# Patient Record
Sex: Female | Born: 1945 | Race: White | Hispanic: No | Marital: Married
Health system: Midwestern US, Community
[De-identification: ages and names within clinical notes are randomized; demographics above are authoritative.]

## PROBLEM LIST (undated history)

## (undated) DIAGNOSIS — H269 Unspecified cataract: Secondary | ICD-10-CM

## (undated) DIAGNOSIS — I10 Essential (primary) hypertension: Secondary | ICD-10-CM

## (undated) DIAGNOSIS — H469 Unspecified optic neuritis: Secondary | ICD-10-CM

## (undated) DIAGNOSIS — I2510 Atherosclerotic heart disease of native coronary artery without angina pectoris: Secondary | ICD-10-CM

## (undated) DIAGNOSIS — E785 Hyperlipidemia, unspecified: Secondary | ICD-10-CM

## (undated) HISTORY — PX: ABDOMINAL HYSTERECTOMY: SHX81

## (undated) HISTORY — DX: Essential (primary) hypertension: I10

## (undated) HISTORY — DX: Unspecified optic neuritis: H46.9

## (undated) HISTORY — PX: CARDIAC CATHETERIZATION: SHX172

## (undated) HISTORY — PX: TOE SURGERY: SHX1073

## (undated) HISTORY — DX: Atherosclerotic heart disease of native coronary artery without angina pectoris: I25.10

## (undated) HISTORY — DX: Hyperlipidemia, unspecified: E78.5

## (undated) HISTORY — PX: CARDIAC SURGERY: SHX584

---

## 2002-02-03 NOTE — Progress Notes (Signed)
CORPORATE CARE CLINICAL SUMMARY   NAME:  Kathleen Leonard, Kathleen Leonard                            SS#:        [id number]   DOB:    12-17-1945                               AGE:        56   SEX:    F                                     LOCATION:  CORPORATE CARE   MR#:    [id number]                                 DATE:       02/03/2002   DICTATING PHYSICIAN:  Angus Palms, D.O.   The patient is here with complaint of right earache the last 24 hours,   preceded by sinus problems the last 2 weeks with congestion and pressure.   She tried Claritin-D and it helped some but not complete.  She has no   cough, no fever, just some slight headache developing with all of this.   She feels a bad taste with the drainage in the back of her mouth and her   teeth hurt on the right side.  She feels some sensations beneath the right   ear on the right side.   ALLERGIES:  None.   MEDICATIONS:  None.   PHYSICAL EXAMINATION:   VITALS:  Blood pressure 126/70, temperature 97.1, pulse 80, respirations   14, 5'6", 146 pounds.   Note, the patient is normally very healthy.  Examination shows slightly   sore right anterior nodes with mild swelling.  Throat red slightly without   pus.  Sinuses inflamed and congested.  Ears show fluid in the right ear to   a mild degree without any erythema.  The left tympanic membrane is fine.   The patient is hydrated.   ASSESSMENT:   1. Maxillary sinusitis.   2. Allergic rhinitis.   3. Otalgia.   PLAN:  Amoxicillin 500 mg t.i.d. x 10 days and Allegra-D q.12h. trial and   see response.  Push fluids and use Tylenol p.r.n.   Electronically Signed By:   Angus Palms, D.O. 07/18/2002 11:19   _____________________________________________   Angus Palms, D.O.   sp  D: 02/03/2002  T: 02/03/2002 10:17 A    [id number]

## 2002-05-29 NOTE — Progress Notes (Signed)
CORPORATE CARE CLINICAL SUMMARY   NAME:  Kathleen Leonard, Kathleen Leonard                            SS#:        [id number]   DOB:    [birth year]                               AGE:        56   SEX:    F                                     LOCATION:  CORPORATE CARE   MR#:    [id number]                                 DATE:       05/29/2002   cc:       Otilio Carpen, [name]             Juanda Bond, [name]   DICTATING PHYSICIAN:  Otilio Carpen, [name]   REFERRING PHYS:   PRIMARY PHYSICIAN:   HISTORY OF PRESENT ILLNESS:   The patient is a 56 year old female who   presents with complaints of sinus congestion and pressure in her sinuses.   She has thick nasal discharge which is draining posteriorly.  She does not   have a cough, she does not have any fever or chills, no nausea, vomiting or   diarrhea.  The patient's have worsened over the past 5 days. The patient   also complains of some swelling in her cheek around her jaw over the past 3   days.  Over the counter medications are not working.  The patient does have   intermittent allergy-type symptoms.   PAST MEDICAL HISTORY:  The patient denies any long-term medical problems.   MEDICATIONS:  No long-term medications.   ALLERGIES:  No known drug allergies.   GYN HISTORY:  The patient has had a hysterectomy at age 51 due to fibroids   and increased bleeding.   PHYSICAL EXAMINATION:   VITAL SIGNS:   Blood pressure 124/76, pulse 96, respirations 20,   temperature 97.5, height: 5'6", weight 151 pounds.   HEENT:   NC/AT, PERRLA., EOMI. Nose with boggy mucous membranes, mucus   discharge is noted. Posterior pharynx is mildly erythematous.  Teeth are in   good repair with fillings in place.  TMs are clear bilaterally.  The   patient is tender along the jaw line on the right to palpation.  She is   unable to open her jaw completely on the right due to discomfort in this   area. The patient is also tender in the submandibular area on the right and    she has shotty anterior cervical lymphadenopathy on the right as well. No   oral lesions are noted on exam of oropharynx.  No significant adenopathy.   HEENT otherwise within normal limits.   NECK:  Otherwise, supple and nontender.  No supraclavicular adenopathy.   HEART:   Regular rate and rhythm without murmur.   LUNGS:   Clear to auscultation bilaterally.   EXTREMITIES:  Normal and nontender.   NEUROLOGICAL:  Cranial nerves II-XII intact  bilaterally.  ASSESSMENT:   1. Upper respiratory infection - ?early sinusitis.   2. Allergic rhinitis.   3. Right submandibularitis (?).   PLAN:   1. P.o. fluids, rest, Tylenol p.r.n.   2. Allegra-D 60 mg p.o. b.i.d. p.r.n. congestion or allergy-type symptoms.      Nasonex nasal spray 1 spray each nostril daily as directed.   3. Augmentin 875 mg p.o. b.i.d. x 10 days.   4. Darvocet-N 100 (20 with no refill) 1 p.o. q.6h. p.r.n. pain.  The      patient may take Motrin or Advil as well.   5. The patient is referred to ENT  for  further evaluation and treatment of      her submandibular gland complaints.  She is to follow up after ENT      evaluation.  She is to call prior for worsening of symptoms or any      problems. The patient is also to follow up with her PCP in regard to her      visit today.  Routine followup otherwise.   Electronically Signed By:   Otilio Carpen, [name] 09/04/2002 11:54   _____________________________________________   Otilio Carpen, [name]   jdm  D: 05/29/2002  T: 05/30/2002  6:19 P    [id number]

## 2003-06-21 NOTE — ED Provider Notes (Signed)
[facility]                      EMERGENCY DEPARTMENT TREATMENT REPORT   NAME:  CONNI, KNIGHTON                          PT. LOCATION:     ER  ER07   MR #:         BILLING #: [id number]          DOS: 06/21/2003   TIME: 8:03 A   [id number]   cc:    Otilio Carpen, [name]   Primary Physician:   TIME SEEN:   [phone].   CHIEF COMPLAINT: "I think I have sinusitis."   HISTORY OF PRESENT ILLNESS:  A 57 year old female presents stating that for   the past 7 days she has had nasal congestion on the right side, not on the   left.  She states that yesterday she started experiencing extreme pain that   she localizes to her maxillary sinus and right upper jaw area.  The patient   states that the pain radiates into her right ear.  She denies fever or   chills, no vomiting.  She has had a couple episodes of loose stools.  The   patient states that last August she had similar pain and was diagnosed with   sinusitis.  She has been using Advil for the pain without relief.  She is   also using an over the counter nasal spray.   REVIEW OF SYSTEMS   CONSTITUTIONAL:   No fever or chills.   ENT:  Right sided nasal congestion with associated right-sided facial pain.   RESPIRATORY:   No cough or shortness of breath.   CARDIOVASCULAR:  No chest pain.   GASTROINTESTINAL:   No vomiting, positive for a couple episode of loose   stools.   GENITOURINARY:  No dysuria.   MUSCULOSKELETAL:   No myalgias.   INTEGUMENTARY:  No rashes.   NEUROLOGICAL:  No headache.   PAST MEDICAL HISTORY:  Unremarkable.   SOCIAL HISTORY: Here with her daughter.   ALLERGIES:  No known drug allergies.   MEDICATIONS:  Advil.   PHYSICAL EXAMINATION:   VITAL SIGNS:  Blood pressure 152/75, pulse 91, respirations 16, temperature   99.8.  Pain is 9/10.   GENERAL APPEARANCE:  The patient appears well developed and well nourished.   Appearance and behavior are age and situation appropriate.    HEENT:   Eyes:  Conjunctivae clear, lids normal.  Pupils equal,   symmetrical, and normally reactive.   Ears/Nose:  Hearing is grossly intact   to voice.  Internal and external examinations of the ears are unremarkable.   Mouth/Throat:  Surfaces of the pharynx, palate, and tongue are pink, moist,   and without lesions.   The patient is tender over the right maxillary   sinus, nontender over the left maxillary sinus, nontender over the   supraorbital sinuses.  The patient is uncomfortable with opening her jaw,   no trismus, no drooling.   NECK:  Supple, symmetrical, trachea midline.   LYMPHATICS:  No cervical or submandibular lymphadenopathy palpated.   PULMONARY:  Lungs clear bilaterally.   CARDIOVASCULAR:  Heart has regular rate and rhythm.   GI:  Abdomen soft, nontender, without complaint of pain to palpation.  No   hepatomegaly or splenomegaly.   MUSCULOSKELETAL:   Stance and gait appear normal.  SKIN:  Warm and dry.   PSYCHIATRIC:   Recent and remote memory appear to be intact.   NEUROLOGICAL:  No focal deficits.   INITIAL ASSESSMENT AND MANAGEMENT PLAN:  A 57 year old female presents   complaining of right-sided facial pain after 7 days of nasal congestion.   We will place the patient on Amoxicillin for sinusitis and give her a   couple of Vicodin for her discomfort.  She will be given 2 Vicodin here.   FINAL DIAGNOSIS:  Acute sinus pain.   DISPOSITION:      1. The patient is discharged home in stable condition, with instructions      to follow up with their regular doctor.  They are advised to return      immediately for any worsening or symptoms of concern.      2. Prescription written for Vicodin #10, Amoxicillin #20 and the patient      is also to use over-the-counter ibuprofen.      3. She is given sinusitis instructions.  She is to drink plenty of      fluids. She is advised that she may use an over-the-counter nasal spray.      She is written a note to return to work tomorrow with instructions to       only use the Vicodin at night if she is working or driving.   Electronically Signed By:   Luiz Iron, [name] 07/07/2003 00:12   ____________________________   Luiz Iron, [name]   rew  D:  06/21/2003  T:  06/21/2003  2:58 P   [id number]   Salem Caster, PA-C

## 2007-10-13 DIAGNOSIS — I219 Acute myocardial infarction, unspecified: Secondary | ICD-10-CM

## 2007-10-13 HISTORY — DX: Acute myocardial infarction, unspecified: I21.9

## 2008-08-13 NOTE — Procedures (Signed)
Test Reason : Pain   Blood Pressure : ***/*** mmHG   Vent. Rate : 110 BPM     Atrial Rate : 110 BPM   P-R Int : 138 ms          QRS Dur : 068 ms   QT Int : 304 ms       P-R-T Axes : 069 -43 -27 degrees   QTc Int : 411 ms   Sinus tachycardia with occasional premature ventricular complexes and fusion   Left axis deviation   Nonspecific ST abnormality   Abnormal ECG   No previous ECGs available   Confirmed by Old, [name], Wayne ([id number]) on 08/13/2008 5:55:29 PM   Referred By: Rick Manolio, MD           Overread By: Wayne Old, [name]

## 2008-08-13 NOTE — ED Provider Notes (Signed)
[facility]                      EMERGENCY DEPARTMENT TREATMENT REPORT   NAME:  Kathleen Leonard, Kathleen Leonard              PT. LOCATION:    [id number]       DOB:  [birth year]                                                                     AGE:  62   MR #:       BILLING #:           DOS: 08/13/2008  TIME: 5:28 P   SEX:  F   [id number]    [id number]   cc:   Primary Physician:   Primary Physician:  Montez Morita, MD   CHIEF COMPLAINT   Irregular EKG (electrocardiogram) at doctor's office, throat pain.   HISTORY OF PRESENT ILLNESS   This is a 62 year old female who was sent over by her primary care   physician after having an irregular EKG in his office.  The patient states   that for the last 2 weeks she has had increasing fatigue when taking her   usual walk during lunch.  She would start to experience and ache in her   throat and increased weakness.  She stated that the episodes initially   lasted about 10 minutes and were resolved when she went home and was able   to rest.  However, over the last 2 weeks the episodes increased in length   and her pain went from throat pain into head pain and her fatigue and   weakness increased significantly.  She began to have a racing heartbeat and   felt as if she was going to faint.  During these episodes she would feel   chest tightness behind her sternum.  She would be diaphoretic and short of   breath; however, she denied nausea, vomiting or diarrhea.  The patient also   noted vision changes at one time where she felt as if she saw "z's" going   across her visual field.  As of now, nothing makes her symptoms better or   worse.   REVIEW OF SYSTEMS   CONSTITUTIONAL:  No fever, chills, weight loss.   EYES:  A Z-like pattern across her visual field.   ENT: No sore throat, runny nose or other URI symptoms.  Throat pain.   RESPIRATORY:  Increasing shortness of breath, denies cough.   CARDIOVASCULAR:  Increasing chest tightness behind her sternum.  Racing   heartbeat.    GASTROINTESTINAL:  No vomiting, diarrhea, or abdominal pain.   GENITOURINARY:  No dysuria, frequency, or urgency.   MUSCULOSKELETAL:  Increasing weakness and fatigue.   INTEGUMENTARY:  Sweating.   NEUROLOGICAL:  Headaches.   PAST MEDICAL HISTORY   Flu vaccine is up to date, otherwise nothing.   SURGICAL HISTORY   Hysterectomy.   PSYCHIATRIC HISTORY   None.   SOCIAL HISTORY   Denies alcohol, tobacco and drug use.   FAMILY HISTORY   Her father does have a history of coronary artery disease and had stents   placed when he was in his 57s.  CURRENT MEDICATIONS   None.   ALLERGIES   No known drug allergies.   The patient was given aspirin at her doctor's office.   PHYSICAL EXAMINATION   VITAL SIGNS:  Blood pressure 149/98, pulse 109, respirations 18,   temperature 97.8, pain 0, O2 saturations 100% on room air.   GENERAL APPEARANCE: The patient appears to be quite concerned.  She is   diaphoretic, pale and anxious.   HEENT:  Eyes:  Conjunctivae clear, lids normal.  Pupils equal, symmetrical,   and normally reactive. Ears/Nose:  Hearing is grossly intact to voice.   Internal and external examinations of the ears and nose are unremarkable.   Mouth/Throat:  Surfaces of the pharynx, palate, and tongue are pink, moist,   and without lesions.   NECK:  Supple, nontender, symmetrical, no masses or JVD, trachea midline.   Thyroid not enlarged, nodular, or tender.   LYMPHATIC:  No cervical or submandibular lymphadenopathy palpated.   RESPIRATORY:  Clear and equal breath sounds.  No respiratory distress,   tachypnea, or accessory muscle use.   CARDIOVASCULAR:  Heart regular, without murmurs, gallops, rubs, or thrills.   PMI not displaced.  DP pulses 2+ and equal bilaterally.  No peripheral   edema or significant varicosities.  Vascular:  Calves soft and nontender.   CHEST:  When pressing on the chest, the patient feels chest tightness.   GI:  Abdomen soft, nontender, without complaint of pain to palpation.  No    hepatomegaly or splenomegaly.  No abdominal or inguinal masses appreciated   by inspection or palpation.   SKIN:  Diaphoretic, cool and clammy.   CONTINUATION BY DR. MANOLIO:   DIAGNOSTIC INTERPRETATION   EKG showed sinus rhythm with occasional PVC, no ischemic changes.  Troponin   was elevated at 1.31 but the rest of the cardiac enzymes were essentially   normal.  BMP, CBC and urinalysis were negative.  A chest x-ray read   negative by radiology as was CT scan of the head.   COURSE IN THE EMERGENCY DEPARTMENT   The patient was watched on cardiac and blood pressure monitors, remained in   sinus rhythm throughout her stay.  When her troponin returned elevated, I   called Dr. Deniece Portela Old from [facility], he has come to see the patient and is   admitting her to Telemetry.   PROCEDURE:  Critical care time including direct patient care, reviewing   medical record, evaluating results of diagnostic testing, discussions with   family members, and consulting with physicians:  40 minutes.   DIAGNOSIS   Acute precordial pain with elevated troponin, rule out myocardial   infarction.   DISPOSITION   The patient is admitted in stable condition to Telemetry.                                           Electronically Signed By:                                           Imogene Burn, [name] 08/24/2008 12:   ____________________________   Imogene Burn, [name]   Dictated by:  Alva Garnet, PA   My signature above authenticates this document and my orders, the final   diagnosis(es), discharge prescription(s) and instructions in the [facility]  P[facility] record.   SB1  D:  08/13/2008  T:  08/13/2008  5:46 P   [id number]/

## 2008-08-13 NOTE — H&P (Signed)
[facility]                              HISTORY AND PHYSICAL                                 Kathleen Leonard, [name]   NAMEERNESHA, Leonard   MR #:    [id number]                  ADM DATE:          08/13/2008   BILLING  [id number]                 PT. LOCATION       [id number]   #:   SS #     [id number]               DOB:  11-Jun-1946   AGE:  62   [name], [name]                    SEX:  F   cc:    Kathleen Leonard, [name]   CHIEF COMPLAINT   Exertional chest discomfort.   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  She went to the emergency room today   after a 2-week history of exertional discomfort.  She lives near the   hospital and regularly walks to work.  When she ambulates to the hospital   she has substernal chest tightness, which is relieved with rest.  It also   radiates to her head.  She went to the emergency room and had an EKG   showing nonspecific ST and T-wave changes; however, her troponin was   elevated at 1.3.  Due to the nature of her pain and elevated troponin I was   asked to evaluate her by Dr. Jama Flavors in the emergency room.   PAST MEDICAL HISTORY   Significant for the absence of hypertension, diabetes.  Her lipid status is   not known.  She is on no medications.   SOCIAL HISTORY   Significant for her not smoking cigarettes.  She does not abuse drugs or   alcohol.   FAMILY HISTORY   Strong for premature atherosclerotic coronary vascular disease.   REVIEW OF SYSTEMS   Significant for the absence of stroke or gastrointestinal bleeding.  There   is no history of joint discomfort, seizures or recent head injury.  She has   no prior cardiac history.   PHYSICAL EXAMINATION   GENERAL:  Physical examination demonstrates an alert and oriented white   female with a blood pressure 110/60, pulse is 80 and regular, respirations   are 12.    HEENT:   Sclerae nonicteric.  Pupils are equal bilaterally.   NECK:  Exam reveals full carotid upstrokes.  There is no jugular venous   distension.   CHEST:   Is clear.   CARDIOVASCULAR:  Exam reveals normal S1 and S2 with no murmurs, gallops or   rubs.  PMI is in the fourth intercostal space midclavicular line.   ABDOMEN:  Abdominal exam is soft and nontender.   EXTREMITIES:   Femoral pulses are equal bilaterally.  She has good distal   pulses and there is no lower  extremity edema.   A chest x-ray is normal.  Mediastinum appears to be normal.  EKG shows   normal sinus rhythm with nonspecific ST and T-wave changes.  Her admission   lab is significant for a normal CBC and a normal UA.  Her troponin is   elevated 1.31.  She had a CT scan of the head without contrast which shows   a hypoattenuation in the mid brain related to streak artifact, otherwise   normal.   IMPRESSION   1. Acute coronary syndrome with elevated troponin and exertional angina.   2. Family history for ischemic heart disease.   3. Unknown lipid status   DISCUSSION   The patient will be admitted to telemetry.  I have initiated medical   therapy consisting of aspirin, beta blocker and statin therapy.  We will   obtain fasting lipids in the morning and I will place her on Lovenox   tonight.  She will be n.p.o. for cardiac catheterization and possible   percutaneous coronary intervention on August 14, 2008 by my partner Dr.   Cyndia Diver.  I have explained to her the goals and risks of this   procedure including infection, bleeding, vascular injury, stroke,   myocardial infarction, death, need for transfer for emergency coronary   artery bypass grafting and use of Plavix long-term.  She is agreeable to   proceed.   I am grateful to Dr. Jama Flavors and Dr. Phylliss Bob for allowing me to have   the privilege of participating in the care this patient.   Electronically Signed By:   Donnel Saxon, [name] 08/14/2008 08:22   ____________________________    Kathleen Leonard, [name]   MD  D:  08/13/2008  T:  08/14/2008  5:20 A   [id number]

## 2008-08-13 NOTE — Procedures (Signed)
Test Reason : Pain   Blood Pressure : ***/*** mmHG   Vent. Rate : 110 BPM     Atrial Rate : 110 BPM   P-R Int : 138 ms          QRS Dur : 068 ms   QT Int : 304 ms       P-R-T Axes : 069 -43 -27 degrees   QTc Int : 411 ms   Sinus tachycardia with occasional premature ventricular complexes and fusion   Left axis deviation   Nonspecific ST abnormality   Abnormal ECG   No previous ECGs available   Confirmed by Old, [name], [facility] ([id number]) on 08/13/2008 5:55:29 PM   Referred By: Angelena Sole, MD           Overread By: Sandre Kitty, [name]

## 2008-08-14 NOTE — Procedures (Signed)
Test Reason : pre op cath   Blood Pressure : ***/*** mmHG   Vent. Rate : 086 BPM     Atrial Rate : 086 BPM   P-R Int : 104 ms          QRS Dur : 088 ms   QT Int : 438 ms       P-R-T Axes : 051 -48 -133 degrees   QTc Int : 524 ms   Sinus rhythm with short PR   Left anterior fascicular block   ST   ST   Prolonged QT   When compared with ECG of 13-Aug-2008 14:00,   Significant changes have occurred with ST abd T changes   Confirmed by Ashby, [name], Charles Jr. ([id number]) on 08/14/2008 9:56:32 AM   Referred By: Rick Manolio, MD           Overread By: Charles J   Ashby, [name]

## 2008-08-14 NOTE — Procedures (Signed)
[facility]                         CARDIAC CATHETERIZATION REPORT                                       ON                                  Leonard, Kathleen E   CATH PHYSICIAN:       Cyndia Diver, [name]   REF. PHYSICIAN:   DATE:               08/14/2008   BILLING#           [id number]   M.R. #:               [id number]   DOB:                1946-02-02   AGE:               62   SEX:                 F   CATH #:   LOCATION:            [id number]   cc:   Cyndia Diver, [name]         Phylliss Bob, [name]         JEFFREY RICH, [name]   PROCEDURES   1. Conscious sedation.   2. Right common femoral artery access.   3. Left heart catheterization.   4. Left ventriculogram.   5. Coronary angiography.   6. StarClose closure, right common femoral artery access site.   CATHETERS/MEDICATIONS   Please see accompanying form for details.   COMPLICATIONS   None   ESTIMATED BLOOD LOSS   Minimal.  No sample was removed.   OPERATOR   Dr. Rosario Jacks   ASSISTANT   None   PRE-PROCEDURE DIAGNOSES   1. Acute coronary syndrome with non-ST elevation myocardial infarction   2. Systemic arterial hypertension.   3. Dyslipidemia   4. Coronary artery disease.   FINDINGS   HEMODYNAMIC RESULTS   Aortic pressure 100/60.  Left ventricle pressure 100/20.  Left ventricular   end-diastolic pressure 20.   LEFT VENTRICULOGRAM   Left ventriculography in the 30 degree RAO projection reveals estimated   ejection 25% with function best preserved at the base.  There is extensive   akinesis of the mid anterior wall, anterior apical wall, apex and   inferoapical wall, consistent with an insult in the LAD distribution.  No   mitral regurgitation.  No gradient across the aortic valve on pullback.   CORONARY ANGIOGRAPHY   Left main coronary artery was large and had a diffuse area of 20-30%   stenosis extending in its midportion.   Left anterior descending artery is relatively small in caliber proximally    and throughout its midportion and then becomes moderate in caliber   distally.  There is severe diffuse disease involving the proximal and mid   LAD with sequential stenotic lesions in the LAD including 50% near ostial   stenosis after the takeoff of a high diagonal followed by area of diffuse   disease up to 80% stenosis in the proximal LAD.  The  mid LAD after the   takeoff of a small caliber diagonal branch has severe diffuse disease   including 70% stenosis immediately after the diagonal followed by diffuse   50% irregularities culminating in a critical 99% hazy stenotic lesion in   the mid LAD.  This lesion is followed by 90% stenosis and thereafter, there   is 40% irregularities in the late mid LAD.  The distal LAD plumps up and is   moderate in caliber and of adequate caliber for bypass.  The apical LAD has   a region of 60% stenosis that traverses the apex and a region of very small   vessel caliber supplying a small area of myocardium.  The length of severe   diffuse disease in the LAD extending from the near ostium to the midportion   of the vessel and the small caliber of the vessel and the left ventricular   dysfunction make this vessel poorly suited for percutaneous intervention   and best served with bypass surgery.  There is also TIMI grade II flow   distally.   Circumflex artery is moderate and nondominant.  There is a large   bifurcating intermediate marginal which is normal and one obtuse marginal   which is small and normal.   Right coronary artery is a moderate caliber dominant vessel with 80% early   mid stenosis followed by diffuse diseased segment in the mid RCA up to 40%   and a relatively small-caliber vessel.  The distal RCA is moderate in   caliber and gives rise to a small to moderate posterior descending artery   and posterior left ventricular branch system, both of which are normal.   Sheath angiography revealed access above the femoral bifurcation.  The    patient underwent successful StarClose closure of the right common femoral   artery access site.   OVERALL IMPRESSION   1. Critical two-vessel coronary artery disease.          A.  Severe diffuse disease in the proximal and mid left anterior   descending artery up to 99% in severity in a              region of   small-vessel caliber with excellent distal target and TIMI grade II flow   poorly suited for                         percutaneous intervention.          B.  An 80% mid right coronary artery stenosis surrounding area of   diffuse disease.          C.  Patent left circumflex and intermediate marginal.   2. Severe left ventricular dysfunction ejection fraction 25% with anterior   wall, anterior apical and distal inferior        wall akinesis.  Degree of   left ventricular dysfunction is out of proportion to the small troponin   rise of 1.8               suggesting stunned myocardium or potentially a   delayed presentation of myocardial infarction, absence of          Q-waves   in the anterior precordium suggests primarily stunned myocardium.   3.    Elevated left ventricle end-diastolic pressure to 20 mmHg.   4.    StarClose closure right common femoral artery access site with   excellent hemostasis.   PLAN  1. The patient will be transferred to the [facility]      for coronary artery bypass surgery to the left anterior descending      artery and right coronary artery.   2. Will start the patient on heparin and Integrilin.  She will be continued      on aspirin with addition of beta blocker to her regimen.  My plan would      be to switch her to Coreg postoperatively.  Given her low blood pressure      and need for bypass surgery, we will hold off on adding an ACE inhibitor      to her regimen.  She will be empirically treated with a statin.  Plan      would be to initiate ACE inhibitor therapy and potentially Inspra after       her bypass surgery to optimize left ventricular recovery and prevent      adverse remodelling.   Thank you very much for allowing [place] put in the care of this patient.   ADDENDUM   The LAD does have a small first diagonal branch that has a 60% ostial   narrowing that I did not mention during my dictation.  The second diagonal   is moderate in caliber and 30-40% mid stenosis.   Electronically Signed By:   Cyndia Diver, [name] 09/26/2008 06:32   ___________________________________________   Cyndia Diver, [name]   AK  D: 08/14/2008  T: 08/14/2008  6:49 P    [id number]

## 2008-08-14 NOTE — Procedures (Signed)
Test Reason : pre op cath   Blood Pressure : ***/*** mmHG   Vent. Rate : 086 BPM     Atrial Rate : 086 BPM   P-R Int : 104 ms          QRS Dur : 088 ms   QT Int : 438 ms       P-R-T Axes : 051 -48 -133 degrees   QTc Int : 524 ms   Sinus rhythm with short PR   Left anterior fascicular block   ST   ST   Prolonged QT   When compared with ECG of 13-Aug-2008 14:00,   Significant changes have occurred with ST abd T changes   Confirmed by Cleon Gustin, [name], Madelynn Done. ([id number]) on 08/14/2008 9:56:32 AM   Referred By: Angelena Sole, MD           Overread By: Merlene Pulling, [name]

## 2008-08-14 NOTE — Procedures (Signed)
[facility]                         CARDIAC CATHETERIZATION REPORT                                       ON                                  Kathleen Leonard, Kathleen Leonard   CATH PHYSICIAN:       Cyndia Diver, [name]   REF. PHYSICIAN:   DATE:               08/14/2008   BILLING#           [id number]   M.R. #:               [id number]   DOB:                [birth year]   AGE:               62   SEX:                 F   CATH #:   LOCATION:            [id number]   cc:   Cyndia Diver, [name]         Phylliss Bob, [name]         JEFFREY RICH, [name]   PROCEDURES   1. Conscious sedation.   2. Right common femoral artery access.   3. Left heart catheterization.   4. Left ventriculogram.   5. Coronary angiography.   6. StarClose closure, right common femoral artery access site.   CATHETERS/MEDICATIONS   Please see accompanying form for details.   COMPLICATIONS   None   ESTIMATED BLOOD LOSS   Minimal.  No sample was removed.   OPERATOR   Dr. Rosario Jacks   ASSISTANT   None   PRE-PROCEDURE DIAGNOSES   1. Acute coronary syndrome with non-ST elevation myocardial infarction   2. Systemic arterial hypertension.   3. Dyslipidemia   4. Coronary artery disease.   FINDINGS   HEMODYNAMIC RESULTS   Aortic pressure 100/60.  Left ventricle pressure 100/20.  Left ventricular   end-diastolic pressure 20.   LEFT VENTRICULOGRAM   Left ventriculography in the 30 degree RAO projection reveals estimated   ejection 25% with function best preserved at the base.  There is extensive   akinesis of the mid anterior wall, anterior apical wall, apex and   inferoapical wall, consistent with an insult in the LAD distribution.  No   mitral regurgitation.  No gradient across the aortic valve on pullback.   CORONARY ANGIOGRAPHY   Left main coronary artery was large and had a diffuse area of 20-30%   stenosis extending in its midportion.   Left anterior descending artery is relatively small in caliber proximally   and throughout its midportion and  then becomes moderate in caliber   distally.  There is severe diffuse disease involving the proximal and mid   LAD with sequential stenotic lesions in the LAD including 50% near ostial   stenosis after the takeoff of a high diagonal followed by area of diffuse   disease up to 80% stenosis in the proximal LAD.  The  mid LAD after the   takeoff of a small caliber diagonal branch has severe diffuse disease   including 70% stenosis immediately after the diagonal followed by diffuse   50% irregularities culminating in a critical 99% hazy stenotic lesion in   the mid LAD.  This lesion is followed by 90% stenosis and thereafter, there   is 40% irregularities in the late mid LAD.  The distal LAD plumps up and is   moderate in caliber and of adequate caliber for bypass.  The apical LAD has   a region of 60% stenosis that traverses the apex and a region of very small   vessel caliber supplying a small area of myocardium.  The length of severe   diffuse disease in the LAD extending from the near ostium to the midportion   of the vessel and the small caliber of the vessel and the left ventricular   dysfunction make this vessel poorly suited for percutaneous intervention   and best served with bypass surgery.  There is also TIMI grade II flow   distally.   Circumflex artery is moderate and nondominant.  There is a large   bifurcating intermediate marginal which is normal and one obtuse marginal   which is small and normal.   Right coronary artery is a moderate caliber dominant vessel with 80% early   mid stenosis followed by diffuse diseased segment in the mid RCA up to 40%   and a relatively small-caliber vessel.  The distal RCA is moderate in   caliber and gives rise to a small to moderate posterior descending artery   and posterior left ventricular branch system, both of which are normal.   Sheath angiography revealed access above the femoral bifurcation.  The   patient underwent successful StarClose closure of the right  common femoral   artery access site.   OVERALL IMPRESSION   1. Critical two-vessel coronary artery disease.          A.  Severe diffuse disease in the proximal and mid left anterior   descending artery up to 99% in severity in a              region of   small-vessel caliber with excellent distal target and TIMI grade II flow   poorly suited for                         percutaneous intervention.          B.  An 80% mid right coronary artery stenosis surrounding area of   diffuse disease.          C.  Patent left circumflex and intermediate marginal.   2. Severe left ventricular dysfunction ejection fraction 25% with anterior   wall, anterior apical and distal inferior        wall akinesis.  Degree of   left ventricular dysfunction is out of proportion to the small troponin   rise of 1.8               suggesting stunned myocardium or potentially a   delayed presentation of myocardial infarction, absence of          Q-waves   in the anterior precordium suggests primarily stunned myocardium.   3.    Elevated left ventricle end-diastolic pressure to 20 mmHg.   4.    StarClose closure right common femoral artery access site with   excellent hemostasis.   PLAN  1. The patient will be transferred to the [facility]      for coronary artery bypass surgery to the left anterior descending      artery and right coronary artery.   2. Will start the patient on heparin and Integrilin.  She will be continued      on aspirin with addition of beta blocker to her regimen.  My plan would      be to switch her to Coreg postoperatively.  Given her low blood pressure      and need for bypass surgery, we will hold off on adding an ACE inhibitor      to her regimen.  She will be empirically treated with a statin.  Plan      would be to initiate ACE inhibitor therapy and potentially Inspra after      her bypass surgery to optimize left ventricular recovery and prevent      adverse remodelling.   Thank you very much for  allowing [place] put in the care of this patient.   ADDENDUM   The LAD does have a small first diagonal branch that has a 60% ostial   narrowing that I did not mention during my dictation.  The second diagonal   is moderate in caliber and 30-40% mid stenosis.   Electronically Signed By:   Cyndia Diver, [name] 09/26/2008 06:32   ___________________________________________   Cyndia Diver, [name]   AK  D: 08/14/2008  T: 08/14/2008  6:49 P    [id number]

## 2008-09-28 LAB — VITAMIN D, 25 HYDROXY: Vitamin D 25-Hydroxy: 41 ng/mL (ref 30–80)

## 2012-10-21 LAB — LIPID PANEL
CHOL/HDL Ratio: 1.8 Ratio (ref 0.0–4.4)
Cholesterol, total: 116 mg/dl — ABNORMAL LOW (ref 140–199)
HDL Cholesterol: 66 mg/dl (ref 40–96)
LDL, calculated: 41 mg/dl (ref 0–130)
Triglyceride: 46 mg/dl (ref 29–150)

## 2012-10-21 LAB — ALT: ALT (SGPT): 25 U/L (ref 12–78)

## 2012-10-21 LAB — CK: CK: 58 U/L (ref 26–192)

## 2012-10-21 LAB — AST: AST (SGOT): 19 U/L (ref 15–37)

## 2016-12-17 NOTE — Other (Signed)
[facility]  PREOPERATIVE INSTRUCTIONS    How to prepare:    Nothing to eat or drink after midnight the night before surgery, unless otherwise specified.  This includes gum and mints.  You may brush your teeth in the morning, however do not swallow any water.  Do not take any medication the morning of your procedure without your physician's approval.  Please speak with the Pre-Admission testing nurse at [phone] regarding specific instructions about your medications.  Please remove all jewelry and body piercings and leave all valuables at home.  Remove nail polish and contact lenses.  You MUST make arrangements for a responsible adult to take you home after your surgery, analgesia or sedation.  It is strongly suggested that a responsible adult stay with you during the first 24 hours.  Please follow your surgeon's instructions regarding the time you need to [facility].  If you are late [facility] due to any unforeseen circumstance or your are ill the morning of surgery and need to cancel or be delayed, please call [facility] at [phone] or [phone]  If you need to cancel your surgery prior to the day of surgery, call your surgeon.      What to bring with you:    Paperwork from your doctor's office that you have been given.  Insurance cards, a picture ID and a method to pay your insurance co-pay.  Wear comfortable, loose fitting clothing that will be easy for you to put back on after surgery.  If you are having cataract surgery, please bring all the eye drops that you have been given by your surgeon, including our antibiotic ointment/drops.  Wear warm clothes from waist down, they will be left on.      I have reviewed the above instructions:              Patient: Kathleen Leonard  Date:     December 17, 2016 Time:   3:25 PM       RN:  Mechele CollinJoan E Williams, LPN  Date:     December 17, 2016 Time:   3:25 PM

## 2016-12-23 ENCOUNTER — Inpatient Hospital Stay: Payer: MEDICARE

## 2016-12-23 MED ORDER — TROPICAMIDE 1 % EYE DROPS
1 % | OPHTHALMIC | Status: AC
Start: 2016-12-23 — End: 2016-12-23
  Administered 2016-12-23 (×2): via OPHTHALMIC

## 2016-12-23 MED ORDER — ACETAMINOPHEN 500 MG TAB
500 mg | ORAL | Status: DC | PRN
Start: 2016-12-23 — End: 2016-12-23

## 2016-12-23 MED ORDER — SODIUM CHLORIDE 0.9 % IV
INTRAVENOUS | Status: DC
Start: 2016-12-23 — End: 2016-12-23
  Administered 2016-12-23: 12:00:00 via INTRAVENOUS

## 2016-12-23 MED ORDER — POVIDONE-IODINE 5 % EYE SOLN
5 % | OPHTHALMIC | Status: DC | PRN
Start: 2016-12-23 — End: 2016-12-23
  Administered 2016-12-23: 13:00:00 via OPHTHALMIC

## 2016-12-23 MED ORDER — MIDAZOLAM 1 MG/ML IJ SOLN
1 mg/mL | INTRAMUSCULAR | Status: DC | PRN
Start: 2016-12-23 — End: 2016-12-23

## 2016-12-23 MED ORDER — PHENYLEPHRINE 10 % EYE DROPS
10 % | Freq: Once | OPHTHALMIC | Status: DC | PRN
Start: 2016-12-23 — End: 2016-12-23

## 2016-12-23 MED ORDER — PREDNISOLONE ACETATE 1 % EYE DROPS, SUSP
1 % | OPHTHALMIC | Status: AC
Start: 2016-12-23 — End: 2016-12-23
  Administered 2016-12-23 (×2): via OPHTHALMIC

## 2016-12-23 MED ORDER — ACETAZOLAMIDE 250 MG TAB
250 mg | Freq: Once | ORAL | Status: AC
Start: 2016-12-23 — End: 2016-12-23
  Administered 2016-12-23: 14:00:00 via ORAL

## 2016-12-23 MED ORDER — LIDOCAINE 1% - PHENYLEPHRINE 1.5% 1 ML OPTHALMIC INJECTION
INTRAOCULAR | Status: DC | PRN
Start: 2016-12-23 — End: 2016-12-23
  Administered 2016-12-23: 13:00:00 via INTRAOCULAR

## 2016-12-23 MED ORDER — PHENYLEPHRINE 2.5 % EYE DROPS
2.5 % | OPHTHALMIC | Status: AC
Start: 2016-12-23 — End: 2016-12-23
  Administered 2016-12-23 (×2): via OPHTHALMIC

## 2016-12-23 MED ORDER — BALANCED SALT IRRIG SOLN COMB2 INTRAOCULAR
INTRAOCULAR | Status: DC | PRN
Start: 2016-12-23 — End: 2016-12-23
  Administered 2016-12-23: 13:00:00 via INTRAOCULAR

## 2016-12-23 MED ORDER — OFLOXACIN 0.3 % EYE DROPS
0.3 % | OPHTHALMIC | Status: AC
Start: 2016-12-23 — End: 2016-12-23
  Administered 2016-12-23 (×2): via OPHTHALMIC

## 2016-12-23 MED ORDER — PROPARACAINE 0.5 % EYE DROPS
0.5 % | Freq: Once | OPHTHALMIC | Status: AC
Start: 2016-12-23 — End: 2016-12-23
  Administered 2016-12-23: 12:00:00 via OPHTHALMIC

## 2016-12-23 MED ORDER — MOXIFLOXACIN 0.1% INTRACAMERAL INJECTION
0.1 % | OPHTHALMIC | Status: DC | PRN
Start: 2016-12-23 — End: 2016-12-23
  Administered 2016-12-23: 13:00:00

## 2016-12-23 MED ORDER — PROPARACAINE 0.5 % EYE DROPS
0.5 % | OPHTHALMIC | Status: DC | PRN
Start: 2016-12-23 — End: 2016-12-23
  Administered 2016-12-23: 13:00:00 via OPHTHALMIC

## 2016-12-23 MED ORDER — NALOXONE 0.4 MG/ML INJECTION
0.4 mg/mL | INTRAMUSCULAR | Status: DC | PRN
Start: 2016-12-23 — End: 2016-12-23

## 2016-12-23 MED ORDER — MIDAZOLAM 1 MG/ML IJ SOLN
1 mg/mL | INTRAMUSCULAR | Status: DC | PRN
Start: 2016-12-23 — End: 2016-12-23
  Administered 2016-12-23 (×3): via INTRAVENOUS

## 2016-12-23 MED ORDER — FLUMAZENIL 0.1 MG/ML IV SOLN
0.1 mg/mL | INTRAVENOUS | Status: DC | PRN
Start: 2016-12-23 — End: 2016-12-23

## 2016-12-23 MED ORDER — CHONDROITIN-SOD HYALURON 3 %-4 % (0.5 ML) 1 %(0.55 ML) INTRAOCULAR KIT
3 %-4 %(0.5 mL) 1 % (0.55 mL) | INTRAOCULAR | Status: DC | PRN
Start: 2016-12-23 — End: 2016-12-23
  Administered 2016-12-23: 13:00:00 via INTRAOCULAR

## 2016-12-23 MED FILL — PHENYLEPHRINE 2.5 % EYE DROPS: 2.5 % | OPHTHALMIC | Qty: 15

## 2016-12-23 MED FILL — PREDNISOLONE ACETATE 1 % EYE DROPS, SUSP: 1 % | OPHTHALMIC | Qty: 5

## 2016-12-23 MED FILL — PHENYLEPHRINE 10 % EYE DROPS: 10 % | OPHTHALMIC | Qty: 5

## 2016-12-23 MED FILL — MIDAZOLAM 1 MG/ML IJ SOLN: 1 mg/mL | INTRAMUSCULAR | Qty: 1

## 2016-12-23 MED FILL — TROPICAMIDE 1 % EYE DROPS: 1 % | OPHTHALMIC | Qty: 15

## 2016-12-23 MED FILL — OFLOXACIN 0.3 % EYE DROPS: 0.3 % | OPHTHALMIC | Qty: 5

## 2016-12-23 MED FILL — SODIUM CHLORIDE 0.9 % IV: INTRAVENOUS | Qty: 1000

## 2016-12-23 MED FILL — MAPAP EXTRA STRENGTH 500 MG TABLET: 500 mg | ORAL | Qty: 1

## 2016-12-23 MED FILL — ACETAZOLAMIDE 250 MG TAB: 250 mg | ORAL | Qty: 1

## 2016-12-23 MED FILL — FLUMAZENIL 0.1 MG/ML IV SOLN: 0.1 mg/mL | INTRAVENOUS | Qty: 2

## 2016-12-23 MED FILL — PROPARACAINE 0.5 % EYE DROPS: 0.5 % | OPHTHALMIC | Qty: 15

## 2016-12-23 MED FILL — NALOXONE 0.4 MG/ML INJECTION: 0.4 mg/mL | INTRAMUSCULAR | Qty: 0.25

## 2016-12-23 NOTE — Anesthesia Pre-Procedure Evaluation (Signed)
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     GI/Hepatic/Renal  Within defined limits              Endo/Other  Within defined limits           Other Findings              Physical Exam    Airway  Mallampati: II  TM Distance: 4 - 6 cm  Neck ROM: normal range of motion   Mouth opening: Normal     Cardiovascular  Regular rate and rhythm,  S1 and S2 normal,  no murmur, click, rub, or gallop             Dental  No notable dental hx       Pulmonary  Breath sounds clear to auscultation               Abdominal  GI exam deferred       Other Findings            Anesthetic Plan    ASA: 3  Anesthesia type: MAC          Induction: Intravenous  Anesthetic plan and risks discussed with: Patient

## 2016-12-23 NOTE — Op Note (Signed)
Operative Note      Patient: Kathleen DriversLinda E Leonard MRN: [id number]  SSN: [id number]    Date of Birth: 08/08/1946  Age: 71 y.o.  Sex: female      Date of Surgery: 12/23/2016    Preoperative Diagnosis: Cataract - visually significant left eye    Postoperative Diagnosis:  Cataract - visually significant left eye    Procedure: Phacoemulsification and insertion of intraocular lens left eye    Anesthesia:  Topical with monitored anesthesia care    Surgeon:  Andres ShadPaul M. Lavena BullionGriffey, [name]    Description:    After obtaining proper informed consent, the operative eye was dilated and the patient was brought to the surgery suite and placed in the supine position.  The operative eye was then prepped and draped in the standard surgical fashion.  A paracentesis was then placed at the approximate 1:00 position and the eye was filled with 1.0% preservative-free Xylocaine.  Viscoelastic was then injected through the paracentesis site and a 2.4 keratome was passed through clear cornea at the approximate 10:00 position.  A bent needle cystatome was then used to create a continuous curvilinear capsulorhexis.  Hydrodisection and hydrodemarcation was then performed followed by phacoemulsification of the nucleus via a four-quadrant divide and conquer technique.  Irrigation and aspiration was then used to remove the remaining cortex material and viscoelastic was injected into the capsular bag.  A SA60WF 20.5 lens was then injected into the capsular bag with the trailing haptic spun into place without difficulty.  Irrigation and aspiration was then used to remove the remaining viscoelastic. The clear corneal wound was hydrated at the stromal level and when checked for a leak revealed none.  0.1 mL of  Moxifloxacin was injected into the anterior chamber.  The patient returned to the recovery area awake and in stable condition.  The patient will follow up tomorrow for further evaluation.      Findings:  Cataract     Limbal Relaxing Incision (LRI):  NO      Miostat / Miochol Administered:  NO    Specimens:  None    Assistants:  None    Complications:  None    Blood Loss:  Zero        Andres ShadPaul M. Lavena BullionGriffey, [name]  December 23, 2016  10:05 AM

## 2016-12-23 NOTE — Anesthesia Post-Procedure Evaluation (Signed)
Post-Anesthesia Evaluation and Assessment    Patient: Kathleen Leonard MRN: [id number]  SSN: [id number]    Date of Birth: [birth year]  Age: 71 y.o.  Sex: female       Cardiovascular Function/Vital Signs  Visit Vitals   ??? BP 113/64   ??? Pulse 67   ??? Temp 36.5 ??C (97.7 ??F)   ??? Resp 18   ??? Ht 5' 6"  (1.676 m)   ??? Wt 71.7 kg (158 lb)   ??? SpO2 94%   ??? BMI 25.5 kg/m2       Patient is status post MAC anesthesia for Procedure(s):  CATARACT EXTRACTION WITH INTRA OCULAR LENS IMPLANT/ LEFT EYE.    Nausea/Vomiting: None    Postoperative hydration reviewed and adequate.    Pain:  Pain Scale 1: Numeric (0 - 10) (12/23/16 0926)  Pain Intensity 1: 0 (12/23/16 0926)   Managed    Neurological Status:       At baseline    Mental Status and Level of Consciousness: Arousable    Pulmonary Status:   O2 Device: Room air (12/23/16 0926)   Adequate oxygenation and airway patent    Complications related to anesthesia: None    Post-anesthesia assessment completed. No concerns    Signed By: Renae Fickle, MD     December 23, 2016

## 2016-12-23 NOTE — Op Note (Signed)
Operative Note      Patient: Kathleen DriversLinda E Lie MRN: [id number]  SSN: [id number]    Date of Birth: 10/23/1945  Age: 71 y.o.  Sex: female      Date of Surgery: 12/23/2016    Preoperative Diagnosis: Cataract - visually significant left eye    Postoperative Diagnosis:  Cataract - visually significant left eye    Procedure: Phacoemulsification and insertion of intraocular lens left eye    Anesthesia:  Topical with monitored anesthesia care    Surgeon:  Andres ShadPaul M. Lavena BullionGriffey, [name]    Description:    After obtaining proper informed consent, the operative eye was dilated and the patient was brought to the surgery suite and placed in the supine position.  The operative eye was then prepped and draped in the standard surgical fashion.  A paracentesis was then placed at the approximate 1:00 position and the eye was filled with 1.0% preservative-free Xylocaine.  Viscoelastic was then injected through the paracentesis site and a 2.4 keratome was passed through clear cornea at the approximate 10:00 position.  A bent needle cystatome was then used to create a continuous curvilinear capsulorhexis.  Hydrodisection and hydrodemarcation was then performed followed by phacoemulsification of the nucleus via a four-quadrant divide and conquer technique.  Irrigation and aspiration was then used to remove the remaining cortex material and viscoelastic was injected into the capsular bag.  A SA60WF 20.5 lens was then injected into the capsular bag with the trailing haptic spun into place without difficulty.  Irrigation and aspiration was then used to remove the remaining viscoelastic. The clear corneal wound was hydrated at the stromal level and when checked for a leak revealed none.  0.1 mL of  Moxifloxacin was injected into the anterior chamber.  The patient returned to the recovery area awake and in stable condition.  The patient will follow up tomorrow for further evaluation.      Findings:  Cataract     Limbal Relaxing Incision (LRI):  NO     Miostat /  Miochol Administered:  NO    Specimens:  None    Assistants:  None    Complications:  None    Blood Loss:  Zero        Andres ShadPaul M. Lavena BullionGriffey, [name]  December 23, 2016  10:05 AM

## 2017-01-01 NOTE — Other (Signed)
[facility]  PREOPERATIVE INSTRUCTIONS    How to prepare:    Nothing to eat or drink after midnight the night before surgery, unless otherwise specified.  This includes gum and mints.  You may brush your teeth in the morning, however do not swallow any water.      Declined other instructions      I have reviewed the above instructions:              Patient: Marilynne DriversLinda E Pineda  Date:     January 01, 2017 Time:   2:51 PM       RN:  Mechele CollinJoan E Williams, LPN  Date:     January 01, 2017 Time:   2:51 PM

## 2017-01-05 NOTE — Anesthesia Pre-Procedure Evaluation (Addendum)
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     GI/Hepatic/Renal  Within defined limits              Endo/Other  Within defined limits           Other Findings            Physical Exam    Airway  Mallampati: II  TM Distance: 4 - 6 cm  Neck ROM: normal range of motion   Mouth opening: Normal     Cardiovascular  Regular rate and rhythm,  S1 and S2 normal,  no murmur, click, rub, or gallop             Dental  No notable dental hx       Pulmonary  Breath sounds clear to auscultation               Abdominal  GI exam deferred       Other Findings            Anesthetic Plan    ASA: 3  Anesthesia type: MAC          Induction: Intravenous  Anesthetic plan and risks discussed with: Patient

## 2017-01-06 ENCOUNTER — Inpatient Hospital Stay: Payer: MEDICARE

## 2017-01-06 MED ORDER — ACETAMINOPHEN 500 MG TAB
500 mg | ORAL | Status: DC | PRN
Start: 2017-01-06 — End: 2017-01-06

## 2017-01-06 MED ORDER — ACETAMINOPHEN 500 MG TAB
500 mg | Freq: Once | ORAL | Status: DC | PRN
Start: 2017-01-06 — End: 2017-01-06

## 2017-01-06 MED ORDER — MIDAZOLAM 1 MG/ML IJ SOLN
1 mg/mL | INTRAMUSCULAR | Status: DC | PRN
Start: 2017-01-06 — End: 2017-01-06
  Administered 2017-01-06 (×2): via INTRAVENOUS

## 2017-01-06 MED ORDER — ONDANSETRON (PF) 4 MG/2 ML INJECTION
4 mg/2 mL | Freq: Once | INTRAMUSCULAR | Status: DC | PRN
Start: 2017-01-06 — End: 2017-01-06

## 2017-01-06 MED ORDER — MIDAZOLAM 1 MG/ML IJ SOLN
1 mg/mL | INTRAMUSCULAR | Status: DC | PRN
Start: 2017-01-06 — End: 2017-01-06

## 2017-01-06 MED ORDER — FLUMAZENIL 0.1 MG/ML IV SOLN
0.1 mg/mL | INTRAVENOUS | Status: DC | PRN
Start: 2017-01-06 — End: 2017-01-06

## 2017-01-06 MED ORDER — MOXIFLOXACIN 0.1% INTRACAMERAL INJECTION
0.1 % | OPHTHALMIC | Status: DC | PRN
Start: 2017-01-06 — End: 2017-01-06
  Administered 2017-01-06: 17:00:00 via OPHTHALMIC

## 2017-01-06 MED ORDER — CHONDROITIN-SOD HYALURON 3 %-4 % (0.5 ML) 1 %(0.55 ML) INTRAOCULAR KIT
3 %-4 %(0.5 mL) 1 % (0.55 mL) | INTRAOCULAR | Status: DC | PRN
Start: 2017-01-06 — End: 2017-01-06
  Administered 2017-01-06: 17:00:00 via INTRAOCULAR

## 2017-01-06 MED ORDER — PHENYLEPHRINE 2.5 % EYE DROPS
2.5 % | OPHTHALMIC | Status: AC
Start: 2017-01-06 — End: 2017-01-06
  Administered 2017-01-06 (×2): via OPHTHALMIC

## 2017-01-06 MED ORDER — ACETAZOLAMIDE 250 MG TAB
250 mg | Freq: Once | ORAL | Status: AC
Start: 2017-01-06 — End: 2017-01-06
  Administered 2017-01-06: 17:00:00 via ORAL

## 2017-01-06 MED ORDER — PROPARACAINE 0.5 % EYE DROPS
0.5 % | Freq: Once | OPHTHALMIC | Status: AC
Start: 2017-01-06 — End: 2017-01-06
  Administered 2017-01-06: 15:00:00 via OPHTHALMIC

## 2017-01-06 MED ORDER — OFLOXACIN 0.3 % EYE DROPS
0.3 % | OPHTHALMIC | Status: AC
Start: 2017-01-06 — End: 2017-01-06
  Administered 2017-01-06 (×2): via OPHTHALMIC

## 2017-01-06 MED ORDER — POVIDONE-IODINE 5 % EYE SOLN
5 % | OPHTHALMIC | Status: DC | PRN
Start: 2017-01-06 — End: 2017-01-06
  Administered 2017-01-06: 17:00:00 via OPHTHALMIC

## 2017-01-06 MED ORDER — LIDOCAINE 1% - PHENYLEPHRINE 1.5% 1 ML OPTHALMIC INJECTION
INTRAOCULAR | Status: DC | PRN
Start: 2017-01-06 — End: 2017-01-06
  Administered 2017-01-06: 17:00:00 via INTRAOCULAR

## 2017-01-06 MED ORDER — TROPICAMIDE 1 % EYE DROPS
1 % | OPHTHALMIC | Status: AC
Start: 2017-01-06 — End: 2017-01-06
  Administered 2017-01-06 (×2): via OPHTHALMIC

## 2017-01-06 MED ORDER — NALOXONE 0.4 MG/ML INJECTION
0.4 mg/mL | INTRAMUSCULAR | Status: DC | PRN
Start: 2017-01-06 — End: 2017-01-06

## 2017-01-06 MED ORDER — BALANCED SALT IRRIG SOLN COMB2 INTRAOCULAR
INTRAOCULAR | Status: DC | PRN
Start: 2017-01-06 — End: 2017-01-06
  Administered 2017-01-06: 17:00:00 via INTRAOCULAR

## 2017-01-06 MED ORDER — PHENYLEPHRINE 10 % EYE DROPS
10 % | Freq: Once | OPHTHALMIC | Status: DC | PRN
Start: 2017-01-06 — End: 2017-01-06

## 2017-01-06 MED ORDER — PROPARACAINE 0.5 % EYE DROPS
0.5 % | OPHTHALMIC | Status: DC | PRN
Start: 2017-01-06 — End: 2017-01-06
  Administered 2017-01-06: 17:00:00 via OPHTHALMIC

## 2017-01-06 MED ORDER — SODIUM CHLORIDE 0.9 % IV
INTRAVENOUS | Status: DC
Start: 2017-01-06 — End: 2017-01-06
  Administered 2017-01-06: 16:00:00 via INTRAVENOUS

## 2017-01-06 MED ORDER — PREDNISOLONE ACETATE 1 % EYE DROPS, SUSP
1 % | OPHTHALMIC | Status: AC
Start: 2017-01-06 — End: 2017-01-06
  Administered 2017-01-06 (×2): via OPHTHALMIC

## 2017-01-06 MED FILL — ONDANSETRON (PF) 4 MG/2 ML INJECTION: 4 mg/2 mL | INTRAMUSCULAR | Qty: 2

## 2017-01-06 MED FILL — MAPAP EXTRA STRENGTH 500 MG TABLET: 500 mg | ORAL | Qty: 1

## 2017-01-06 MED FILL — SODIUM CHLORIDE 0.9 % IV: INTRAVENOUS | Qty: 1000

## 2017-01-06 MED FILL — PROPARACAINE 0.5 % EYE DROPS: 0.5 % | OPHTHALMIC | Qty: 15

## 2017-01-06 MED FILL — PREDNISOLONE ACETATE 1 % EYE DROPS, SUSP: 1 % | OPHTHALMIC | Qty: 5

## 2017-01-06 MED FILL — PHENYLEPHRINE 10 % EYE DROPS: 10 % | OPHTHALMIC | Qty: 5

## 2017-01-06 MED FILL — OFLOXACIN 0.3 % EYE DROPS: 0.3 % | OPHTHALMIC | Qty: 5

## 2017-01-06 MED FILL — ACETAZOLAMIDE 250 MG TAB: 250 mg | ORAL | Qty: 1

## 2017-01-06 MED FILL — FLUMAZENIL 0.1 MG/ML IV SOLN: 0.1 mg/mL | INTRAVENOUS | Qty: 2

## 2017-01-06 MED FILL — NALOXONE 0.4 MG/ML INJECTION: 0.4 mg/mL | INTRAMUSCULAR | Qty: 0.25

## 2017-01-06 MED FILL — MIDAZOLAM 1 MG/ML IJ SOLN: 1 mg/mL | INTRAMUSCULAR | Qty: 1

## 2017-01-06 MED FILL — TROPICAMIDE 1 % EYE DROPS: 1 % | OPHTHALMIC | Qty: 15

## 2017-01-06 MED FILL — PHENYLEPHRINE 2.5 % EYE DROPS: 2.5 % | OPHTHALMIC | Qty: 15

## 2017-01-06 MED FILL — MAPAP EXTRA STRENGTH 500 MG TABLET: 500 mg | ORAL | Qty: 2

## 2017-01-06 NOTE — Anesthesia Post-Procedure Evaluation (Signed)
Post-Anesthesia Evaluation and Assessment    Patient: Kathleen Leonard MRN: [id number]  SSN: [id number]    Date of Birth: 05/03/1946  Age: 71 y.o.  Sex: female       Cardiovascular Function/Vital Signs  Visit Vitals   ??? BP 132/75   ??? Pulse 71   ??? Temp 36.7 ??C (98.1 ??F)   ??? Resp 16   ??? Ht 5' 6"  (1.676 m)   ??? Wt 71.7 kg (158 lb)   ??? SpO2 95%   ??? BMI 25.5 kg/m2       Patient is status post MAC anesthesia for Procedure(s):  CATARACT EXTRACTION WITH INTRA OCULAR LENS IMPLANT/ RT EYE.    Nausea/Vomiting: None    Postoperative hydration reviewed and adequate.    Pain:  Pain Scale 1: Numeric (0 - 10) (01/06/17 1313)  Pain Intensity 1: 0 (01/06/17 1313)   Managed    Neurological Status:       At baseline    Mental Status and Level of Consciousness: Arousable    Pulmonary Status:   O2 Device: Room air (01/06/17 1313)   Adequate oxygenation and airway patent    Complications related to anesthesia: None    Post-anesthesia assessment completed. No concerns    Signed By: Charline Bills, MD     January 06, 2017

## 2017-01-06 NOTE — Op Note (Signed)
Operative Note      Patient: Kathleen Leonard MRN: [id number]  SSN: [id number]    Date of Birth: 08/27/1946  Age: 71 y.o.  Sex: female      Date of Surgery: 01/06/2017    Preoperative Diagnosis: Cataract - visually significant right eye    Postoperative Diagnosis:  Cataract - visually significant right eye    Procedure: Phacoemulsification and insertion of intraocular lens right eye    Anesthesia:  Topical with monitored anesthesia care    Surgeon:  Kathleen Leonard, [name]    Description:    After obtaining proper informed consent, the operative eye was dilated and the patient was brought to the surgery suite and placed in the supine position.  The operative eye was then prepped and draped in the standard surgical fashion.  A paracentesis was then placed at the approximate 11:00 position and the eye was filled with 1.0% preservative-free Xylocaine.  Viscoelastic was then injected through the paracentesis site and a 2.4 keratome was passed through clear cornea at the approximate 9:00 position.  A bent needle cystatome was then used to create a continuous curvilinear capsulorhexis.  Hydrodisection and hydrodemarcation was then performed followed by phacoemulsification of the nucleus via a four-quadrant divide and conquer technique.  Irrigation and aspiration was then used to remove the remaining cortex material and viscoelastic was injected into the capsular bag.  A SA60WF 20.0. lens was then injected into the capsular bag with the trailing haptic spun into place without difficulty.  Irrigation and aspiration was then used to remove the remaining viscoelastic. The clear corneal wound was hydrated at the stromal level and when checked for a leak revealed none.  0.1 mL of Moxifloxacin was injected into the anterior chamber.  The patient returned to the recovery area awake and in stable condition.  The patient will follow up tomorrow for further evaluation.      Findings:  Cataract     Limbal Relaxing Incision (LRI):  NO      Miostat / Miochol Administered:  NO    Specimens:  None    Assistants:  None    Complications:  None    Blood Loss:  Zero        Kathleen Leonard, [name]  January 06, 2017  1:03 PM

## 2017-01-06 NOTE — Op Note (Signed)
Operative Note      Patient: Kathleen Leonard MRN: [id number]  SSN: [id number]    Date of Birth: 11/27/1945  Age: 71 y.o.  Sex: female      Date of Surgery: 01/06/2017    Preoperative Diagnosis: Cataract - visually significant right eye    Postoperative Diagnosis:  Cataract - visually significant right eye    Procedure: Phacoemulsification and insertion of intraocular lens right eye    Anesthesia:  Topical with monitored anesthesia care    Surgeon:  Kathleen Leonard, [name]    Description:    After obtaining proper informed consent, the operative eye was dilated and the patient was brought to the surgery suite and placed in the supine position.  The operative eye was then prepped and draped in the standard surgical fashion.  A paracentesis was then placed at the approximate 11:00 position and the eye was filled with 1.0% preservative-free Xylocaine.  Viscoelastic was then injected through the paracentesis site and a 2.4 keratome was passed through clear cornea at the approximate 9:00 position.  A bent needle cystatome was then used to create a continuous curvilinear capsulorhexis.  Hydrodisection and hydrodemarcation was then performed followed by phacoemulsification of the nucleus via a four-quadrant divide and conquer technique.  Irrigation and aspiration was then used to remove the remaining cortex material and viscoelastic was injected into the capsular bag.  A SA60WF 20.0. lens was then injected into the capsular bag with the trailing haptic spun into place without difficulty.  Irrigation and aspiration was then used to remove the remaining viscoelastic. The clear corneal wound was hydrated at the stromal level and when checked for a leak revealed none.  0.1 mL of Moxifloxacin was injected into the anterior chamber.  The patient returned to the recovery area awake and in stable condition.  The patient will follow up tomorrow for further evaluation.      Findings:  Cataract     Limbal Relaxing Incision (LRI):  NO     Miostat  / Miochol Administered:  NO    Specimens:  None    Assistants:  None    Complications:  None    Blood Loss:  Zero        Kathleen Leonard, [name]  January 06, 2017  1:03 PM

## 2017-02-03 ENCOUNTER — Inpatient Hospital Stay: Admit: 2017-02-03 | Discharge: 2017-02-03 | Payer: MEDICARE | Primary: Family Medicine

## 2017-02-03 DIAGNOSIS — E7800 Pure hypercholesterolemia, unspecified: Secondary | ICD-10-CM

## 2017-02-03 LAB — LIPID PANEL
CHOL/HDL Ratio: 2.5 Ratio (ref 0.0–4.4)
Cholesterol, total: 136 mg/dl — ABNORMAL LOW (ref 140–199)
HDL Cholesterol: 54 mg/dl (ref 40–96)
LDL, calculated: 66 mg/dl (ref 0–130)
Triglyceride: 81 mg/dl (ref 29–150)

## 2018-08-26 ENCOUNTER — Inpatient Hospital Stay: Admit: 2018-08-26 | Discharge: 2018-08-26 | Payer: MEDICARE | Primary: Family Medicine

## 2018-08-26 DIAGNOSIS — I25810 Atherosclerosis of coronary artery bypass graft(s) without angina pectoris: Secondary | ICD-10-CM

## 2018-08-26 LAB — HEPATIC FUNCTION PANEL
ALT (SGPT): 23 U/L (ref 12–78)
ALT: 23 U/L (ref 12–78)
AST (SGOT): 16 U/L (ref 15–37)
AST: 16 U/L (ref 15–37)
Albumin: 3.7 gm/dl (ref 3.4–5.0)
Albumin: 3.7 gm/dl (ref 3.4–5.0)
Alk. phosphatase: 76 U/L (ref 45–117)
Alkaline Phosphatase: 76 U/L (ref 45–117)
Bilirubin, Direct: 0.1 mg/dl (ref 0.0–0.2)
Bilirubin, direct: 0.1 mg/dl (ref 0.0–0.2)
Bilirubin, total: 0.3 mg/dl (ref 0.2–1.0)
Protein, total: 6.7 gm/dl (ref 6.4–8.2)
Total Bilirubin: 0.3 mg/dl (ref 0.2–1.0)
Total Protein: 6.7 gm/dl (ref 6.4–8.2)

## 2018-08-26 LAB — LIPID PANEL
CHOL/HDL Ratio: 3.1 Ratio (ref 0.0–4.4)
Chol/HDL Ratio: 3.1 Ratio (ref 0.0–4.4)
Cholesterol, Total: 140 mg/dl (ref 140–199)
Cholesterol, total: 140 mg/dl (ref 140–199)
HDL Cholesterol: 45 mg/dl (ref 40–96)
HDL: 45 mg/dl (ref 40–96)
LDL Calculated: 72 mg/dl (ref 0–130)
LDL, calculated: 72 mg/dl (ref 0–130)
Triglyceride: 117 mg/dl (ref 29–150)
Triglycerides: 117 mg/dl (ref 29–150)

## 2021-02-06 ENCOUNTER — Ambulatory Visit (INDEPENDENT_AMBULATORY_CARE_PROVIDER_SITE_OTHER): Payer: Medicare Other | Admitting: Nurse Practitioner

## 2021-02-06 ENCOUNTER — Encounter: Payer: Self-pay | Admitting: Nurse Practitioner

## 2021-02-06 ENCOUNTER — Other Ambulatory Visit: Payer: Self-pay

## 2021-02-06 VITALS — BP 134/75 | HR 83 | Temp 98.0°F | Ht 64.02 in | Wt 153.5 lb

## 2021-02-06 DIAGNOSIS — Z1231 Encounter for screening mammogram for malignant neoplasm of breast: Secondary | ICD-10-CM

## 2021-02-06 DIAGNOSIS — I219 Acute myocardial infarction, unspecified: Secondary | ICD-10-CM | POA: Insufficient documentation

## 2021-02-06 DIAGNOSIS — Z7689 Persons encountering health services in other specified circumstances: Secondary | ICD-10-CM

## 2021-02-06 NOTE — Assessment & Plan Note (Signed)
Stable. Referral placed for Cardiology for patient to establish care.  Previously seen by a Cardiologist in [place]. She will be due for her annual check in September.

## 2021-02-06 NOTE — Progress Notes (Deleted)
   BP 134/75   Pulse 83   Temp 98 F (36.7 C)   Ht 5' 4.02" (1.626 m)   Wt 153 lb 8 oz (69.6 kg)   SpO2 97%   BMI 26.34 kg/m    Subjective:    Patient ID: Debbie Fleming, female    DOB: 12-Mar-1946, 75 y.o.   MRN: [id number]  HPI: Debbie Fleming is a 75 y.o. female  Chief Complaint  Patient presents with  . Establish Care   Patient presents to clinic to establish care with new PCP.  Patient reports a history of ***. Patient denies a history of: Hypertension, Elevated Cholesterol, Thyroid problems, Depression, Anxiety, Neurological problems, and Abdominal problems.   Relevant past medical, surgical, family and social history reviewed and updated as indicated. Interim medical history since our last visit reviewed. Allergies and medications reviewed and updated.  Review of Systems  Per HPI unless specifically indicated above     Objective:    BP 134/75   Pulse 83   Temp 98 F (36.7 C)   Ht 5' 4.02" (1.626 m)   Wt 153 lb 8 oz (69.6 kg)   SpO2 97%   BMI 26.34 kg/m   Wt Readings from Last 3 Encounters:  02/06/21 153 lb 8 oz (69.6 kg)    Physical Exam  No results found for this or any previous visit.    Assessment & Plan:   Problem List Items Addressed This Visit   None      Follow up plan: No follow-ups on file.

## 2021-02-06 NOTE — Progress Notes (Signed)
BP 134/75   Pulse 83   Temp 98 F (36.7 C)   Ht 5' 4.02" (1.626 m)   Wt 153 lb 8 oz (69.6 kg)   SpO2 97%   BMI 26.34 kg/m    Subjective:    Patient ID: Debbie Fleming, female    DOB: 1945-11-26, 75 y.o.   MRN: [id number]  HPI: Debbie Fleming is a 75 y.o. female  Chief Complaint  Patient presents with  . Establish Care   Patient presents to clinic to establish care with new PCP.  Patient reports a history of a heart attack in 2009 and bypass surgery in 2009.  Patient had her uterus removed when she was in her 30s due to a large cyst that was causing her issues.  Patient states that Carvedilol and Simvastatin are for prevention purposes versus treatment of HTN and HLD.  Patient just moved here from [place].   Patient has had an abnormal mammogram that she is being monitored for.  She was previously getting her mammograms in [place].  Patient denies a history of: Hypertension, Elevated Cholesterol, Thyroid problems, Depression, Anxiety, Neurological problems, and Abdominal problems.    Denies HA, CP, SOB, dizziness, palpitations, visual changes, and lower extremity swelling.  Relevant past medical, surgical, family and social history reviewed and updated as indicated. Interim medical history since our last visit reviewed. Allergies and medications reviewed and updated.  Review of Systems  Eyes: Negative for visual disturbance.  Respiratory: Negative for cough, chest tightness and shortness of breath.   Cardiovascular: Negative for chest pain, palpitations and leg swelling.  Neurological: Negative for dizziness and headaches.    Per HPI unless specifically indicated above     Objective:    BP 134/75   Pulse 83   Temp 98 F (36.7 C)   Ht 5' 4.02" (1.626 m)   Wt 153 lb 8 oz (69.6 kg)   SpO2 97%   BMI 26.34 kg/m   Wt Readings from Last 3 Encounters:  02/06/21 153 lb 8 oz (69.6 kg)    Physical Exam Vitals and nursing note reviewed.  Constitutional:      General: She  is not in acute distress.    Appearance: Normal appearance. She is normal weight. She is not ill-appearing, toxic-appearing or diaphoretic.  HENT:     Head: Normocephalic.     Right Ear: External ear normal.     Left Ear: External ear normal.     Nose: Nose normal.     Mouth/Throat:     Mouth: Mucous membranes are moist.     Pharynx: Oropharynx is clear.  Eyes:     General:        Right eye: No discharge.        Left eye: No discharge.     Extraocular Movements: Extraocular movements intact.     Conjunctiva/sclera: Conjunctivae normal.     Pupils: Pupils are equal, round, and reactive to light.  Cardiovascular:     Rate and Rhythm: Normal rate and regular rhythm.     Heart sounds: No murmur heard.   Pulmonary:     Effort: Pulmonary effort is normal. No respiratory distress.     Breath sounds: Normal breath sounds. No wheezing or rales.  Musculoskeletal:     Cervical back: Normal range of motion and neck supple.  Skin:    General: Skin is warm and dry.     Capillary Refill: Capillary refill takes less than 2 seconds.  Neurological:  General: No focal deficit present.     Mental Status: She is alert and oriented to person, place, and time. Mental status is at baseline.  Psychiatric:        Mood and Affect: Mood normal.        Behavior: Behavior normal.        Thought Content: Thought content normal.        Judgment: Judgment normal.     No results found for this or any previous visit.    Assessment & Plan:   Problem List Items Addressed This Visit      Cardiovascular and Mediastinum   Myocardial infarction ([facility]CC) - Primary    Stable. Referral placed for Cardiology for patient to establish care.  Previously seen by a Cardiologist in [place]. She will be due for her annual check in September.      Relevant Medications   carvedilol (COREG) 12.5 MG tablet   simvastatin (ZOCOR) 40 MG tablet   Other Relevant Orders   Ambulatory referral to Cardiology     Other Visit Diagnoses    Encounter for screening mammogram for malignant neoplasm of breast       Order placed for patient to have Mammogram done.   Relevant Orders   MM Digital Screening   Encounter to establish care       Return in 3 months for physical and fasting labs.        Follow up plan: Return in about 3 months (around 05/08/2021) for Physical and Fasting labs.

## 2021-02-07 ENCOUNTER — Other Ambulatory Visit: Payer: Self-pay | Admitting: Ophthalmology

## 2021-02-07 DIAGNOSIS — H532 Diplopia: Secondary | ICD-10-CM

## 2021-02-07 DIAGNOSIS — H518 Other specified disorders of binocular movement: Secondary | ICD-10-CM

## 2021-02-13 ENCOUNTER — Other Ambulatory Visit: Payer: Self-pay

## 2021-02-13 ENCOUNTER — Ambulatory Visit
Admission: RE | Admit: 2021-02-13 | Discharge: 2021-02-13 | Disposition: A | Discharge status: Home / Self Care | Payer: Medicare Other | Source: Ambulatory Visit | Attending: Ophthalmology | Admitting: Ophthalmology

## 2021-02-13 DIAGNOSIS — H532 Diplopia: Secondary | ICD-10-CM | POA: Insufficient documentation

## 2021-02-13 DIAGNOSIS — H518 Other specified disorders of binocular movement: Secondary | ICD-10-CM | POA: Diagnosis present

## 2021-02-13 MED ORDER — GADOBUTROL 1 MMOL/ML IV SOLN
6.0000 mL | Freq: Once | INTRAVENOUS | Status: AC | PRN
  Administered 2021-02-13: 6 mL via INTRAVENOUS

## 2021-02-14 ENCOUNTER — Other Ambulatory Visit
Admission: RE | Admit: 2021-02-14 | Discharge: 2021-02-14 | Disposition: A | Discharge status: Home / Self Care | Payer: Medicare Other | Source: Ambulatory Visit | Attending: Ophthalmology | Admitting: Ophthalmology

## 2021-02-14 DIAGNOSIS — H532 Diplopia: Secondary | ICD-10-CM | POA: Diagnosis present

## 2021-02-14 DIAGNOSIS — H518 Other specified disorders of binocular movement: Secondary | ICD-10-CM | POA: Diagnosis not present

## 2021-02-14 LAB — CBC WITH DIFFERENTIAL/PLATELET
Abs Immature Granulocytes: 0.01 10*3/uL (ref 0.00–0.07)
Basophils Absolute: 0.1 10*3/uL (ref 0.0–0.1)
Basophils Relative: 1 %
Eosinophils Absolute: 0.1 10*3/uL (ref 0.0–0.5)
Eosinophils Relative: 2 %
HCT: 36.8 % (ref 36.0–46.0)
Hemoglobin: 12.6 g/dL (ref 12.0–15.0)
Immature Granulocytes: 0 %
Lymphocytes Relative: 33 %
Lymphs Abs: 2 10*3/uL (ref 0.7–4.0)
MCH: 32.7 pg (ref 26.0–34.0)
MCHC: 34.2 g/dL (ref 30.0–36.0)
MCV: 95.6 fL (ref 80.0–100.0)
Monocytes Absolute: 0.4 10*3/uL (ref 0.1–1.0)
Monocytes Relative: 6 %
Neutro Abs: 3.4 10*3/uL (ref 1.7–7.7)
Neutrophils Relative %: 58 %
Platelets: 280 10*3/uL (ref 150–400)
RBC: 3.85 MIL/uL — ABNORMAL LOW (ref 3.87–5.11)
RDW: 11.9 % (ref 11.5–15.5)
WBC: 6 10*3/uL (ref 4.0–10.5)
nRBC: 0 % (ref 0.0–0.2)

## 2021-02-25 LAB — MISC LABCORP TEST (SEND OUT): Labcorp test code: 165600

## 2021-05-07 NOTE — Progress Notes (Signed)
BP (!) 145/66   Pulse (!) 57   Temp 98.3 F (36.8 C)   Ht 5' 4.49" (1.638 m)   Wt 157 lb 8 oz (71.4 kg)   SpO2 97%   BMI 26.63 kg/m    Subjective:    Patient ID: Debbie Fleming, female    DOB: 1946-06-13, 75 y.o.   MRN: [id number]  HPI: Debbie Fleming is a 75 y.o. female  Chief Complaint  Patient presents with   Hyperlipidemia   Patient's blood pressure is 115/80.  Patient is here today for a routine follow up. She denies concerns at visit today.    HYPERLIPIDEMIA Hyperlipidemia status: excellent compliance Satisfied with current treatment?  no Side effects:  no Medication compliance: excellent compliance Past cholesterol meds: simvastatin (zocor) Supplements: none Aspirin:  no The ASCVD Risk score Mikey Bussing DC Jr., et al., 2013) failed to calculate for the following reasons:   The patient has a prior MI or stroke diagnosis Chest pain:  no Coronary artery disease:  no Family history CAD:  no Family history early CAD:  no   Denies HA, CP, SOB, dizziness, palpitations, visual changes, and lower extremity swelling.   Relevant past medical, surgical, family and social history reviewed and updated as indicated. Interim medical history since our last visit reviewed. Allergies and medications reviewed and updated.  Review of Systems  Eyes:  Negative for visual disturbance.  Respiratory:  Negative for cough, chest tightness and shortness of breath.   Cardiovascular:  Negative for chest pain, palpitations and leg swelling.  Neurological:  Negative for dizziness and headaches.   Per HPI unless specifically indicated above     Objective:    BP (!) 145/66   Pulse (!) 57   Temp 98.3 F (36.8 C)   Ht 5' 4.49" (1.638 m)   Wt 157 lb 8 oz (71.4 kg)   SpO2 97%   BMI 26.63 kg/m   Wt Readings from Last 3 Encounters:  05/08/21 157 lb 8 oz (71.4 kg)  02/06/21 153 lb 8 oz (69.6 kg)    Physical Exam Vitals and nursing note reviewed.  Constitutional:      General: She is not in  acute distress.    Appearance: Normal appearance. She is normal weight. She is not ill-appearing, toxic-appearing or diaphoretic.  HENT:     Head: Normocephalic.     Right Ear: External ear normal.     Left Ear: External ear normal.     Nose: Nose normal.     Mouth/Throat:     Mouth: Mucous membranes are moist.     Pharynx: Oropharynx is clear.  Eyes:     General:        Right eye: No discharge.        Left eye: No discharge.     Extraocular Movements: Extraocular movements intact.     Conjunctiva/sclera: Conjunctivae normal.     Pupils: Pupils are equal, round, and reactive to light.  Cardiovascular:     Rate and Rhythm: Normal rate and regular rhythm.     Heart sounds: No murmur heard. Pulmonary:     Effort: Pulmonary effort is normal. No respiratory distress.     Breath sounds: Normal breath sounds. No wheezing or rales.  Musculoskeletal:     Cervical back: Normal range of motion and neck supple.  Skin:    General: Skin is warm and dry.     Capillary Refill: Capillary refill takes less than 2 seconds.  Neurological:  General: No focal deficit present.     Mental Status: She is alert and oriented to person, place, and time. Mental status is at baseline.  Psychiatric:        Mood and Affect: Mood normal.        Behavior: Behavior normal.        Thought Content: Thought content normal.        Judgment: Judgment normal.    Results for orders placed or performed during the hospital encounter of 02/14/21  CBC with Differential/Platelet  Result Value Ref Range   WBC 6.0 4.0 - 10.5 K/uL   RBC 3.85 (L) 3.87 - 5.11 MIL/uL   Hemoglobin 12.6 12.0 - 15.0 g/dL   HCT 36.8 36.0 - 46.0 %   MCV 95.6 80.0 - 100.0 fL   MCH 32.7 26.0 - 34.0 pg   MCHC 34.2 30.0 - 36.0 g/dL   RDW 11.9 11.5 - 15.5 %   Platelets 280 150 - 400 K/uL   nRBC 0.0 0.0 - 0.2 %   Neutrophils Relative % 58 %   Neutro Abs 3.4 1.7 - 7.7 K/uL   Lymphocytes Relative 33 %   Lymphs Abs 2.0 0.7 - 4.0 K/uL    Monocytes Relative 6 %   Monocytes Absolute 0.4 0.1 - 1.0 K/uL   Eosinophils Relative 2 %   Eosinophils Absolute 0.1 0.0 - 0.5 K/uL   Basophils Relative 1 %   Basophils Absolute 0.1 0.0 - 0.1 K/uL   Immature Granulocytes 0 %   Abs Immature Granulocytes 0.01 0.00 - 0.07 K/uL  Miscellaneous [facility] test (send-out)  Result Value Ref Range   [facility] test code [id number]    [facility] test name MYASTHENIA    Misc [facility] result COMMENT       Assessment & Plan:   Problem List Items Addressed This Visit       Cardiovascular and Mediastinum   Myocardial infarction ([place]) - Primary   Relevant Medications   carvedilol (COREG) 12.5 MG tablet   simvastatin (ZOCOR) 40 MG tablet   Other Relevant Orders   Comp Met (CMET)   Lipid Profile   Other Visit Diagnoses     Screening for ischemic heart disease       Relevant Orders   Comp Met (CMET)   Lipid Profile   Encounter for hepatitis C screening test for low risk patient       Relevant Orders   Hepatitis C antibody   Osteoporosis, unspecified osteoporosis type, unspecified pathological fracture presence       Relevant Orders   DG Bone Density   Abnormal mammogram       Relevant Orders   MM Digital Diagnostic Bilat        Follow up plan: No follow-ups on file.

## 2021-05-08 ENCOUNTER — Ambulatory Visit (INDEPENDENT_AMBULATORY_CARE_PROVIDER_SITE_OTHER): Payer: Medicare Other | Admitting: Nurse Practitioner

## 2021-05-08 ENCOUNTER — Encounter: Payer: Self-pay | Admitting: Nurse Practitioner

## 2021-05-08 ENCOUNTER — Other Ambulatory Visit: Payer: Self-pay

## 2021-05-08 ENCOUNTER — Other Ambulatory Visit: Payer: Self-pay | Admitting: Family Medicine

## 2021-05-08 VITALS — BP 128/78 | HR 57 | Temp 98.3°F | Ht 64.49 in | Wt 157.5 lb

## 2021-05-08 DIAGNOSIS — Z780 Asymptomatic menopausal state: Secondary | ICD-10-CM

## 2021-05-08 DIAGNOSIS — Z1159 Encounter for screening for other viral diseases: Secondary | ICD-10-CM

## 2021-05-08 DIAGNOSIS — R928 Other abnormal and inconclusive findings on diagnostic imaging of breast: Secondary | ICD-10-CM | POA: Diagnosis not present

## 2021-05-08 DIAGNOSIS — Z136 Encounter for screening for cardiovascular disorders: Secondary | ICD-10-CM

## 2021-05-08 DIAGNOSIS — I219 Acute myocardial infarction, unspecified: Secondary | ICD-10-CM

## 2021-05-08 DIAGNOSIS — I252 Old myocardial infarction: Secondary | ICD-10-CM

## 2021-05-08 DIAGNOSIS — Z1231 Encounter for screening mammogram for malignant neoplasm of breast: Secondary | ICD-10-CM

## 2021-05-08 MED ORDER — CARVEDILOL 12.5 MG PO TABS: 12.5000 mg | ORAL_TABLET | Freq: Two times a day (BID) | ORAL | 1 refills | Status: DC

## 2021-05-08 MED ORDER — SIMVASTATIN 40 MG PO TABS: ORAL_TABLET | ORAL | 1 refills | Status: DC

## 2021-05-08 NOTE — Assessment & Plan Note (Signed)
Chronic. Well controlled.  Continues on Carvedilol 12.5mg  BID and Simvastatin 40mg  daily.  Continue to follow up with Cardiology.

## 2021-05-09 LAB — LIPID PANEL
Chol/HDL Ratio: 3.1 ratio (ref 0.0–4.4)
Cholesterol, Total: 150 mg/dL (ref 100–199)
HDL: 48 mg/dL (ref >39)
LDL Chol Calc (NIH): 83 mg/dL (ref 0–99)
Triglycerides: 104 mg/dL (ref 0–149)
VLDL Cholesterol Cal: 19 mg/dL (ref 5–40)

## 2021-05-09 LAB — COMPREHENSIVE METABOLIC PANEL
ALT: 13 IU/L (ref 0–32)
AST: 18 IU/L (ref 0–40)
Albumin/Globulin Ratio: 2.4 — ABNORMAL HIGH (ref 1.2–2.2)
Albumin: 4.7 g/dL (ref 3.7–4.7)
Alkaline Phosphatase: 71 IU/L (ref 44–121)
BUN/Creatinine Ratio: 27 (ref 12–28)
BUN: 23 mg/dL (ref 8–27)
Bilirubin Total: 0.3 mg/dL (ref 0.0–1.2)
CO2: 24 mmol/L (ref 20–29)
Calcium: 9.6 mg/dL (ref 8.7–10.3)
Chloride: 104 mmol/L (ref 96–106)
Creatinine, Ser: 0.84 mg/dL (ref 0.57–1.00)
Globulin, Total: 2 g/dL (ref 1.5–4.5)
Glucose: 96 mg/dL (ref 65–99)
Potassium: 4.6 mmol/L (ref 3.5–5.2)
Sodium: 142 mmol/L (ref 134–144)
Total Protein: 6.7 g/dL (ref 6.0–8.5)
eGFR: 73 mL/min/{1.73_m2} (ref >59)

## 2021-05-09 LAB — HEPATITIS C ANTIBODY: Hep C Virus Ab: <0.1 s/co ratio (ref 0.0–0.9)

## 2021-05-09 NOTE — Progress Notes (Signed)
Please let patient know her lab work looks great.  We will continue to check labs at future visits. Continue with current medication regimen. Follow up as discussed.

## 2021-05-20 ENCOUNTER — Telehealth: Payer: Self-pay

## 2021-05-20 NOTE — Telephone Encounter (Signed)
Copied from CRM [id number]. Topic: General - Other >> May 19, 2021  4:01 PM Gwenlyn Fudge wrote: Reason for CRM: Pt called stating that she is needing to have a information regarding her medical records sent in from [facility]. She states that she has been advised that her mammogram images have been sent to office, but is not able to set up  an appt with breast center due to them not having access. Please advise.

## 2021-05-20 NOTE — Telephone Encounter (Signed)
Release has been sent to [facility] but they have released the information into the cloud.  Called and requested they send a disc.  Release resent.  Patient aware.

## 2021-05-26 ENCOUNTER — Other Ambulatory Visit: Payer: Self-pay | Admitting: *Deleted

## 2021-05-26 ENCOUNTER — Inpatient Hospital Stay
Admission: RE | Admit: 2021-05-26 | Discharge: 2021-05-26 | Disposition: A | Discharge status: Home / Self Care | Payer: Self-pay | Source: Ambulatory Visit | Attending: *Deleted | Admitting: *Deleted

## 2021-05-26 ENCOUNTER — Other Ambulatory Visit: Payer: Self-pay | Admitting: Nurse Practitioner

## 2021-05-26 DIAGNOSIS — N6001 Solitary cyst of right breast: Secondary | ICD-10-CM

## 2021-05-26 DIAGNOSIS — R922 Inconclusive mammogram: Secondary | ICD-10-CM

## 2021-05-26 DIAGNOSIS — Z1231 Encounter for screening mammogram for malignant neoplasm of breast: Secondary | ICD-10-CM

## 2021-06-05 ENCOUNTER — Ambulatory Visit
Admission: RE | Admit: 2021-06-05 | Discharge: 2021-06-05 | Disposition: A | Discharge status: Home / Self Care | Payer: Medicare Other | Source: Ambulatory Visit | Attending: Nurse Practitioner | Admitting: Nurse Practitioner

## 2021-06-05 ENCOUNTER — Other Ambulatory Visit: Payer: Self-pay

## 2021-06-05 ENCOUNTER — Other Ambulatory Visit: Payer: Self-pay | Admitting: Nurse Practitioner

## 2021-06-05 DIAGNOSIS — R922 Inconclusive mammogram: Secondary | ICD-10-CM

## 2021-06-05 DIAGNOSIS — Z780 Asymptomatic menopausal state: Secondary | ICD-10-CM | POA: Diagnosis present

## 2021-06-05 DIAGNOSIS — N6001 Solitary cyst of right breast: Secondary | ICD-10-CM | POA: Insufficient documentation

## 2021-06-05 NOTE — Progress Notes (Signed)
Please let patient know her mammogram showed a stable mass as seen on previous mammograms.  The radiologist recommends she follows up in 1 year with a repeat diagnostic mammogram.

## 2021-06-05 NOTE — Progress Notes (Signed)
Breast ultrasound shows a stable mass as seen on previous imaging.  Recommend she repeat the imaging in 1 year.

## 2021-06-09 NOTE — Progress Notes (Signed)
Please let patient know that her Bone Density shows osteopenia.  I recommend she take a over the counter Vitamin D with calcium supplement.  Please let me know if she has any questions.

## 2021-06-30 ENCOUNTER — Encounter: Payer: Self-pay | Admitting: Cardiovascular Disease

## 2021-06-30 ENCOUNTER — Ambulatory Visit (INDEPENDENT_AMBULATORY_CARE_PROVIDER_SITE_OTHER): Payer: Medicare Other | Admitting: Cardiovascular Disease

## 2021-06-30 ENCOUNTER — Other Ambulatory Visit: Payer: Self-pay

## 2021-06-30 VITALS — BP 150/70 | HR 67 | Ht 66.0 in | Wt 157.4 lb

## 2021-06-30 DIAGNOSIS — I25118 Atherosclerotic heart disease of native coronary artery with other forms of angina pectoris: Secondary | ICD-10-CM | POA: Diagnosis not present

## 2021-06-30 DIAGNOSIS — I10 Essential (primary) hypertension: Secondary | ICD-10-CM

## 2021-06-30 DIAGNOSIS — E782 Mixed hyperlipidemia: Secondary | ICD-10-CM

## 2021-06-30 DIAGNOSIS — Z951 Presence of aortocoronary bypass graft: Secondary | ICD-10-CM

## 2021-06-30 MED ORDER — EZETIMIBE 10 MG PO TABS: 10.0000 mg | ORAL_TABLET | Freq: Every day | ORAL | 3 refills | Status: DC

## 2021-06-30 NOTE — Patient Instructions (Addendum)
Have family research "CT coronary calcium score" $99 out-of-pocket expense  Medication Instructions:   Please START Zetia 10 mg once a day for cholesterol  If you need a refill on your cardiac medications before your next appointment, please call your pharmacy.   Lab work: No new labs needed  Testing/Procedures: No new testing needed  Follow-Up: At [facility], you and your health needs are our priority.  As part of our continuing mission to provide you with exceptional heart care, we have created designated Provider Care Teams.  These Care Teams include your primary Cardiologist (physician) and Advanced Practice Providers (APPs -  Physician Assistants and Nurse Practitioners) who all work together to provide you with the care you need, when you need it.  You will need a follow up appointment in 12 months  Providers on your designated Care Team:   Nicolasa Ducking, NP Eula Listen, PA-C Marisue Ivan, PA-C Cadence Ewing, [place]  COVID-19 Vaccine Information can be found at: [email or web address] For questions related to vaccine distribution or appointments, please email [email or web address] or call [phone].

## 2021-06-30 NOTE — Progress Notes (Signed)
Cardiology Office Note  Date:  06/30/2021   ID:  Debbie Fleming, DOB [birth year], MRN [id number]  PCP:  Larae Grooms, NP   Chief Complaint  Patient presents with   New Patient (Initial Visit)    Establish care for a history of CAD/CABG x 2. "Doing well." Medications reviewed by the patient verbally.     HPI:  Ms. Debbie Fleming is a 75 year old woman with history of Coronary artery disease, MI 2009 History of CABG 2009 moved to the area from [place] Who presents by referral from Larae Grooms for consultation of her coronary disease  In 2009, after work felt tired,  Diaphoretic, nausea/SOB Neck tightness Had cardiac cath Then CABG  BP at home 115 systolic   active Has stationary bike, Does 30 ' and 15"  Hospital records requested and reviewed with her on today's visit Catheterization November 2009, had NSTEMI 20 to 30% left main stenosis 50% near ostial stenosis after takeoff of high diagonal followed by 80% stenosis proximal LAD, 70 to 99% tandem lesions in the mid LAD, 60% apical LAD, no circumflex disease, 80% mid RCA  August 16, 2008 underwent LIMA to the mid LAD, vein graft to the RCA  Echocardiogram November 2011 ejection fraction 50 to 55% mild MR, TR  Stress test March 2017 no ischemia normal ejection fraction   EKG personally reviewed by myself on todays visit NSR rate 67 bpm, St and T wave ABN V1 to V6   PMH:   has a past medical history of Coronary artery disease, Hyperlipidemia, Hypertension, Myocardial infarction (HCC) (2009), and Myocardial infarction (HCC) (02/06/2021).  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 Swallow whole.     Calcium  Carbonate-Vitamin D 600-200 MG-UNIT TABS Take by mouth 2 (two) times daily.     carvedilol (COREG) 12.5 MG tablet Take 1 tablet (12.5 mg total) by mouth 2 (two) times daily. 180 tablet 1   magnesium oxide (MAG-OX) 400 MG tablet Take 400 mg by mouth daily.     Multiple Vitamin (MULTIVITAMIN) tablet Take 1 tablet by mouth daily.     simvastatin (ZOCOR) 40 MG tablet SMARTSIG:1 Tablet(s) By Mouth Every Evening 90 tablet 1   No current facility-administered medications for this visit.     Allergies:   Patient has no known allergies.   Social History:  The patient  reports that she has never smoked. She has never used smokeless tobacco. She reports that she does not drink alcohol and does not use drugs.   Family History:   family history includes Breast cancer in her maternal grandmother; Cancer in her maternal grandfather, mother, and son; Heart disease in her father, paternal grandfather, and paternal grandmother.    Review of Systems: ROS   PHYSICAL EXAM: VS:  BP (!) 150/70 (BP Location: Right Arm, Patient Position: Sitting, Cuff Size: Normal)   Pulse 67   Ht 5\' 6"  (1.676 m)   Wt 157 lb 6 oz (71.4 kg)   SpO2 98%   BMI 25.40 kg/m  , BMI Body mass index is 25.4 kg/m. GEN: Well nourished, well developed, in no acute  distress HEENT: normal Neck: no JVD, carotid bruits, or masses Cardiac: RRR; no murmurs, rubs, or gallops,no edema  Respiratory:  clear to auscultation bilaterally, normal work of breathing GI: soft, nontender, nondistended, + BS MS: no deformity or atrophy Skin: warm and dry, no rash Neuro:  Strength and sensation are intact Psych: euthymic mood, full affect   Recent Labs: 02/14/2021: Hemoglobin 12.6; Platelets 280 05/08/2021: ALT 13; BUN 23; Creatinine, Ser 0.84; Potassium 4.6; Sodium 142    Lipid Panel Lab Results  Component Value Date   CHOL 150 05/08/2021   HDL 48 05/08/2021   LDLCALC 83 05/08/2021   TRIG 104 05/08/2021      Wt Readings from Last 3  Encounters:  06/30/21 157 lb 6 oz (71.4 kg)  05/08/21 157 lb 8 oz (71.4 kg)  02/06/21 153 lb 8 oz (69.6 kg)       ASSESSMENT AND PLAN:  Problem List Items Addressed This Visit   None Visit Diagnoses     Coronary artery disease of native artery of native heart with stable angina pectoris (HCC)    -  Primary   Relevant Medications   aspirin EC 81 MG tablet   Other Relevant Orders   EKG 12-Lead   Hx of CABG       Relevant Orders   EKG 12-Lead   Mixed hyperlipidemia       Relevant Medications   aspirin EC 81 MG tablet   Primary hypertension       Relevant Medications   aspirin EC 81 MG tablet   Other Relevant Orders   EKG 12-Lead      Coronary disease with stable angina Prior catheterization records reviewed, bypass records reviewed Discussed prior anginal symptoms Denies anginal symptoms at this time, active, uses of her bike 30 to 45 minutes on a regular basis with no symptoms Continue beta-blocker  Hyperlipidemia Continue simvastatin, add Zetia to achieve goal LDL 60 or less  Essential hypertension Blood pressure well controlled at home per records 115 systolic, Anxious on her visit today elevated blood pressure, no changes to her medications  Discussed options for family screening for coronary disease including CT coronary calcium scoring  Total encounter time more than 60 minutes  Greater than 50% was spent in counseling and coordination of care with the patient    Signed, Dossie Arbour, [name], Ph.D. [facility] [place], [place] [phone]

## 2021-07-01 ENCOUNTER — Telehealth: Payer: Self-pay | Admitting: Nurse Practitioner

## 2021-07-01 NOTE — Telephone Encounter (Signed)
Copied from CRM [id number]. Topic: Medicare AWV >> Jul 01, 2021  5:36 PM Leigh Aurora wrote: Reason for CRM:  Left message for patient to call back and schedule Medicare Annual Wellness Visit (AWV) to be done virtually or by telephone.  No hx of AWV eligible as of 01/10/13  Please schedule at anytime with [facility] Advisor.      45 Minutes appointment   Any questions, please call me at [phone]

## 2021-07-02 NOTE — Telephone Encounter (Signed)
Pt has called back and states that she just had a good visit with her cardiologist this week and she is doing very well, she states she is not interested at this time. Thanks s for touching base. Will stay in touch with her dr and heart dr.

## 2021-11-05 ENCOUNTER — Ambulatory Visit: Payer: Medicare Other | Admitting: Nurse Practitioner

## 2021-12-12 ENCOUNTER — Telehealth: Payer: Self-pay | Admitting: Nurse Practitioner

## 2021-12-12 NOTE — Telephone Encounter (Signed)
Copied from [place] [id number]. Topic: Medicare AWV ?>> Dec 12, 2021 11:09 AM Lavonia Drafts wrote: ?Reason for CRM:  ?Left message for patient to call back and schedule Medicare Annual Wellness Visit (AWV) to be done virtually or by telephone. ? ?No hx of AWV eligible as of 04/11/17 ? ?Please schedule at anytime with [facility] Health Advisor.     ? ?45 Minutes appointment  ? ?Any questions, please call me at [phone] ?

## 2021-12-16 ENCOUNTER — Ambulatory Visit (INDEPENDENT_AMBULATORY_CARE_PROVIDER_SITE_OTHER): Payer: Medicare Other | Admitting: *Deleted

## 2021-12-16 DIAGNOSIS — Z0000 Encounter for general adult medical examination without abnormal findings: Secondary | ICD-10-CM | POA: Diagnosis not present

## 2021-12-16 NOTE — Patient Instructions (Signed)
Ms. Debbie Fleming , Thank you for taking time to come for your Medicare Wellness Visit. I appreciate your ongoing commitment to your health goals. Please review the following plan we discussed and let me know if I can assist you in the future.   Screening recommendations/referrals: Colonoscopy: up to date Mammogram: up to date Bone Density: up to date Recommended yearly ophthalmology/optometry visit for glaucoma screening and checkup Recommended yearly dental visit for hygiene and checkup  Vaccinations: Influenza vaccine: up to date Pneumococcal vaccine: up to date  Tdap vaccine: Education provided Shingles vaccine: Education provided    Advanced directives: Education provided  Conditions/risks identified:   Next appointment:    Preventive Care 65 Years and Older, Female Preventive care refers to lifestyle choices and visits with your health care provider that can promote health and wellness. What does preventive care include? A yearly physical exam. This is also called an annual well check. Dental exams once or twice a year. Routine eye exams. Ask your health care provider how often you should have your eyes checked. Personal lifestyle choices, including: Daily care of your teeth and gums. Regular physical activity. Eating a healthy diet. Avoiding tobacco and drug use. Limiting alcohol use. Practicing safe sex. Taking low-dose aspirin every day. Taking vitamin and mineral supplements as recommended by your health care provider. What happens during an annual well check? The services and screenings done by your health care provider during your annual well check will depend on your age, overall health, lifestyle risk factors, and family history of disease. Counseling  Your health care provider may ask you questions about your: Alcohol use. Tobacco use. Drug use. Emotional well-being. Home and relationship well-being. Sexual activity. Eating habits. History of falls. Memory and  ability to understand (cognition). Work and work Astronomer. Reproductive health. Screening  You may have the following tests or measurements: Height, weight, and BMI. Blood pressure. Lipid and cholesterol levels. These may be checked every 5 years, or more frequently if you are over 63 years old. Skin check. Lung cancer screening. You may have this screening every year starting at age 69 if you have a 30-pack-year history of smoking and currently smoke or have quit within the past 15 years. Fecal occult blood test (FOBT) of the stool. You may have this test every year starting at age 20. Flexible sigmoidoscopy or colonoscopy. You may have a sigmoidoscopy every 5 years or a colonoscopy every 10 years starting at age 56. Hepatitis C blood test. Hepatitis B blood test. Sexually transmitted disease (STD) testing. Diabetes screening. This is done by checking your blood sugar (glucose) after you have not eaten for a while (fasting). You may have this done every 1-3 years. Bone density scan. This is done to screen for osteoporosis. You may have this done starting at age 67. Mammogram. This may be done every 1-2 years. Talk to your health care provider about how often you should have regular mammograms. Talk with your health care provider about your test results, treatment options, and if necessary, the need for more tests. Vaccines  Your health care provider may recommend certain vaccines, such as: Influenza vaccine. This is recommended every year. Tetanus, diphtheria, and acellular pertussis (Tdap, Td) vaccine. You may need a Td booster every 10 years. Zoster vaccine. You may need this after age 22. Pneumococcal 13-valent conjugate (PCV13) vaccine. One dose is recommended after age 95. Pneumococcal polysaccharide (PPSV23) vaccine. One dose is recommended after age 76. Talk to your health care provider about which screenings  and vaccines you need and how often you need them. This information is  not intended to replace advice given to you by your health care provider. Make sure you discuss any questions you have with your health care provider. Document Released: 10/25/2015 Document Revised: 06/17/2016 Document Reviewed: 07/30/2015 Elsevier Interactive Patient Education  2017 [employer] Prevention in the Home Falls can cause injuries. They can happen to people of all ages. There are many things you can do to make your home safe and to help prevent falls. What can I do on the outside of my home? Regularly fix the edges of walkways and driveways and fix any cracks. Remove anything that might make you trip as you walk through a door, such as a raised step or threshold. Trim any bushes or trees on the path to your home. Use bright outdoor lighting. Clear any walking paths of anything that might make someone trip, such as rocks or tools. Regularly check to see if handrails are loose or broken. Make sure that both sides of any steps have handrails. Any raised decks and porches should have guardrails on the edges. Have any leaves, snow, or ice cleared regularly. Use sand or salt on walking paths during winter. Clean up any spills in your garage right away. This includes oil or grease spills. What can I do in the bathroom? Use night lights. Install grab bars by the toilet and in the tub and shower. Do not use towel bars as grab bars. Use non-skid mats or decals in the tub or shower. If you need to sit down in the shower, use a plastic, non-slip stool. Keep the floor dry. Clean up any water that spills on the floor as soon as it happens. Remove soap buildup in the tub or shower regularly. Attach bath mats securely with double-sided non-slip rug tape. Do not have throw rugs and other things on the floor that can make you trip. What can I do in the bedroom? Use night lights. Make sure that you have a light by your bed that is easy to reach. Do not use any sheets or blankets that  are too big for your bed. They should not hang down onto the floor. Have a firm chair that has side arms. You can use this for support while you get dressed. Do not have throw rugs and other things on the floor that can make you trip. What can I do in the kitchen? Clean up any spills right away. Avoid walking on wet floors. Keep items that you use a lot in easy-to-reach places. If you need to reach something above you, use a strong step stool that has a grab bar. Keep electrical cords out of the way. Do not use floor polish or wax that makes floors slippery. If you must use wax, use non-skid floor wax. Do not have throw rugs and other things on the floor that can make you trip. What can I do with my stairs? Do not leave any items on the stairs. Make sure that there are handrails on both sides of the stairs and use them. Fix handrails that are broken or loose. Make sure that handrails are as long as the stairways. Check any carpeting to make sure that it is firmly attached to the stairs. Fix any carpet that is loose or worn. Avoid having throw rugs at the top or bottom of the stairs. If you do have throw rugs, attach them to the floor with carpet tape.  Make sure that you have a light switch at the top of the stairs and the bottom of the stairs. If you do not have them, ask someone to add them for you. What else can I do to help prevent falls? Wear shoes that: Do not have high heels. Have rubber bottoms. Are comfortable and fit you well. Are closed at the toe. Do not wear sandals. If you use a stepladder: Make sure that it is fully opened. Do not climb a closed stepladder. Make sure that both sides of the stepladder are locked into place. Ask someone to hold it for you, if possible. Clearly mark and make sure that you can see: Any grab bars or handrails. First and last steps. Where the edge of each step is. Use tools that help you move around (mobility aids) if they are needed. These  include: Canes. Walkers. Scooters. Crutches. Turn on the lights when you go into a dark area. Replace any light bulbs as soon as they burn out. Set up your furniture so you have a clear path. Avoid moving your furniture around. If any of your floors are uneven, fix them. If there are any pets around you, be aware of where they are. Review your medicines with your doctor. Some medicines can make you feel dizzy. This can increase your chance of falling. Ask your doctor what other things that you can do to help prevent falls. This information is not intended to replace advice given to you by your health care provider. Make sure you discuss any questions you have with your health care provider. Document Released: 07/25/2009 Document Revised: 03/05/2016 Document Reviewed: 11/02/2014 Elsevier Interactive Patient Education  2017 [employer].

## 2021-12-16 NOTE — Progress Notes (Signed)
Subjective:   Debbie Fleming is a 76 y.o. female who presents for Medicare Annual (Subsequent) preventive examination.  I connected with  Joelyn Oms on 12/16/21 by a telephone enabled telemedicine application and verified that I am speaking with the correct person using two identifiers.   I discussed the limitations of evaluation and management by telemedicine. The patient expressed understanding and agreed to proceed.  Patient location: home  Provider location: Tele-Health not in office    Review of Systems     Cardiac Risk Factors include: advanced age (>23men, >63 women);family history of premature cardiovascular disease     Objective:    Today's Vitals   There is no height or weight on file to calculate BMI.  Advanced Directives 12/16/2021  Does Patient Have a Medical Advance Directive? No  Would patient like information on creating a medical advance directive? No - Patient declined    Current Medications (verified) Outpatient Encounter Medications as of 12/16/2021  Medication Sig   aspirin EC 81 MG tablet Take 81 mg by mouth daily. Swallow whole.   Calcium Carbonate-Vitamin D 600-200 MG-UNIT TABS Take by mouth 2 (two) times daily.   carvedilol (COREG) 12.5 MG tablet Take 1 tablet (12.5 mg total) by mouth 2 (two) times daily.   ezetimibe (ZETIA) 10 MG tablet Take 1 tablet (10 mg total) by mouth daily.   magnesium oxide (MAG-OX) 400 MG tablet Take 400 mg by mouth daily.   Multiple Vitamin (MULTIVITAMIN) tablet Take 1 tablet by mouth daily.   simvastatin (ZOCOR) 40 MG tablet SMARTSIG:1 Tablet(s) By Mouth Every Evening   No facility-administered encounter medications on file as of 12/16/2021.    Allergies (verified) Patient has no known allergies.   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   Family History  Problem Relation Age of Onset   Cancer Mother        Lung   Heart disease Father    Breast cancer Maternal Grandmother    Cancer Maternal Grandfather    Heart disease Paternal Grandmother    Heart disease Paternal Grandfather    Cancer Son    Social History   Socioeconomic History   Marital status: Married    Spouse name: Not on file   Number of children: Not on file   Years of education: Not on file   Highest education level: Not on file  Occupational History   Not on file  Tobacco Use   Smoking status: Never   Smokeless tobacco: Never  Vaping Use   Vaping Use: Never used  Substance and Sexual Activity   Alcohol use: Never   Drug use: Never   Sexual activity: Yes    Birth control/protection: Post-menopausal  Other Topics Concern   Not on file  Social History Narrative   Not on file   Social Determinants of Health   Financial Resource Strain: Low Risk    Difficulty of Paying Living Expenses: Not hard at all  Food Insecurity: No Food Insecurity   Worried About Radiation protection practitioner of Food in the Last Year: Never  true   Ran Out of Food in the Last Year: Never true  Transportation Needs: No Transportation Needs   Lack of Transportation (Medical): No   Lack of Transportation (Non-Medical): No  Physical Activity: Sufficiently Active   Days of Exercise per Week: 4 days   Minutes of Exercise per Session: 40 min  Stress: No Stress Concern Present   Feeling of Stress : Not at all  Social Connections: Moderately Integrated   Frequency of Communication with Friends and Family: More than three times a week   Frequency of Social Gatherings with Friends and Family: Twice a week   Attends Religious Services: More than 4 times per year   Active Member of [employer] or Organizations: No   Attends Museum/gallery exhibitions officer: Never   Marital Status: Married    Tobacco Counseling Counseling given: Not Answered   Clinical Intake:  Pre-visit preparation completed: Yes  Pain : No/denies pain     Nutritional Risks: None Diabetes: No  How often do you need to have someone help you when you read instructions, pamphlets, or other written materials from your doctor or pharmacy?: 1 - Never  Diabetic?  no  Interpreter Needed?: No  Information entered by :: Remi Haggard LPN   Activities of Daily Living In your present state of health, do you have any difficulty performing the following activities: 12/16/2021 02/06/2021  Hearing? N N  Vision? N Y  Difficulty concentrating or making decisions? N N  Walking or climbing stairs? N Y  Comment - knee pain  Dressing or bathing? N N  Doing errands, shopping? N N  Preparing Food and eating ? N -  Using the Toilet? N -  In the past six months, have you accidently leaked urine? N -  Do you have problems with loss of bowel control? N -  Managing your Medications? N -  Managing your Finances? N -  Housekeeping or managing your Housekeeping? N -  Some recent data might be hidden    Patient Care Team: Larae Grooms, NP as PCP - General (Nurse Practitioner)  Indicate any recent Medical Services you may have received from other than [facility] providers in the past year (date may be approximate).     Assessment:   This is a routine wellness examination for H[place]go.  Hearing/Vision screen Hearing Screening - Comments:: No trouble hearing Vision Screening - Comments:: Up to date Dr. Druscilla Brownie   Dietary issues and exercise activities discussed: Current Exercise Habits: Home exercise routine, Type of exercise: walking (stationary bike), Time (Minutes): 40, Frequency (Times/Week): 6, Weekly Exercise (Minutes/Week): 240, Intensity: Moderate   Goals Addressed             This Visit's Progress    Patient Stated       Continue current  lifestyle       Depression Screen PHQ 2/9 Scores 12/16/2021 02/06/2021  PHQ - 2 Score 0 0    Fall Risk Fall Risk  12/16/2021 02/06/2021  Falls in the past year? 0 0  Number falls in past yr: 0 0  Injury with Fall? 0 0  Follow up Falls evaluation completed;Falls prevention discussed;Education provided -    FALL RISK PREVENTION PERTAINING TO THE HOME:  Any stairs in or around the home? No  If so, are there any without handrails? No  Home free of loose throw rugs in walkways, pet beds, electrical cords, etc? Yes  Adequate lighting in your home to reduce risk of falls? Yes  ASSISTIVE DEVICES UTILIZED TO PREVENT FALLS:  Life alert? No  Use of a cane, walker or w/c? No  Grab bars in the bathroom? Yes  Shower chair or bench in shower? No  Elevated toilet seat or a handicapped toilet? No   TIMED UP AND GO:  Was the test performed? No .    Cognitive Function:  Normal cognitive status assessed by direct observation by this Nurse Health Advisor. No abnormalities found.          Immunizations Immunization History  Administered Date(s) Administered   Influenza-Unspecified 07/16/2021   Moderna Sars-Covid-2 Vaccination 01/08/2020, 02/05/2020, 08/15/2020, 01/23/2021   Pneumococcal Conjugate-13 06/16/2015    TDAP status: Due, Education has been provided regarding the importance of this vaccine. Advised may receive this vaccine at local pharmacy or Health Dept. Aware to provide a copy of the vaccination record if obtained from local pharmacy or Health Dept. Verbalized acceptance and understanding.  Flu Vaccine status: Up to date  Pneumococcal vaccine status: Up to date  per patient in [place] vaccine status: Completed vaccines  Qualifies for Shingles Vaccine? No   Zostavax completed No   Shingrix Completed?: Yes  Screening Tests Health Maintenance  Topic Date Due   COLONOSCOPY (Pts 45-33yrs Insurance coverage will need to be confirmed)  Never done   COVID-19  Vaccine (5 - Booster for Moderna series) 03/20/2021   Zoster Vaccines- Shingrix (1 of 2) 03/18/2022 (Originally 01/17/1965)   Pneumonia Vaccine 68+ Years old (2 - PPSV23 if available, else PCV20) 12/17/2022 (Originally 06/15/2016)   TETANUS/TDAP  12/17/2022 (Originally 01/17/1965)   MAMMOGRAM  06/06/2023   INFLUENZA VACCINE  Completed   DEXA SCAN  Completed   Hepatitis C Screening  Completed   HPV VACCINES  Aged Out    Health Maintenance  Health Maintenance Due  Topic Date Due   COLONOSCOPY (Pts 45-36yrs Insurance coverage will need to be confirmed)  Never done   COVID-19 Vaccine (5 - Booster for Moderna series) 03/20/2021    Colorectal cancer screening: Type of screening: Colonoscopy. Completed  . Repeat every 10 years  Mammogram status: Completed  . Repeat every year  Bone Density status: Completed  . Results reflect: Bone density results: OSTEOPENIA. Repeat every 2 years.  Lung Cancer Screening: (Low Dose CT Chest recommended if Age 13-80 years, 30 pack-year currently smoking OR have quit w/in 15years.) does not qualify.   Lung Cancer Screening Referral:   Additional Screening:  Hepatitis C Screening:   qualify; Completed 2022  Vision Screening: Recommended annual ophthalmology exams for early detection of glaucoma and other disorders of the eye. Is the patient up to date with their annual eye exam?  Yes  Who is the provider or what is the name of the office in which the patient attends annual eye exams? [facility] If pt is not established with a provider, would they like to be referred to a provider to establish care? No .   Dental Screening: Recommended annual dental exams for proper oral hygiene  Community Resource Referral / Chronic Care Management: CRR required this visit?  No   CCM required this visit?  No      Plan:     I have personally reviewed and noted the following in the patients chart:   Medical and social history Use of alcohol, tobacco or illicit  drugs  Current medications and supplements including opioid prescriptions.  Functional ability and status Nutritional status Physical activity Advanced directives List of other physicians Hospitalizations, surgeries, and ER  visits in previous 12 months Vitals Screenings to include cognitive, depression, and falls Referrals and appointments  In addition, I have reviewed and discussed with patient certain preventive protocols, quality metrics, and best practice recommendations. A written personalized care plan for preventive services as well as general preventive health recommendations were provided to patient.     Remi Haggard, LPN   11/14/6331   Nurse Notes:

## 2022-01-22 ENCOUNTER — Other Ambulatory Visit: Payer: Self-pay | Admitting: Nurse Practitioner

## 2022-01-22 NOTE — Telephone Encounter (Signed)
Requested Prescriptions  ?Pending Prescriptions Disp Refills  ?? carvedilol (COREG) 12.5 MG tablet [Pharmacy Med Name: CARVEDILOL 12.5MG  TABLETS] 180 tablet 1  ?  Sig: TAKE 1 TABLET(12.5 MG) BY MOUTH TWICE DAILY  ?  ? Cardiovascular: Beta Blockers 3 Failed - 01/22/2022 10:58 AM  ?  ?  Failed - Last BP in normal range  ?  BP Readings from Last 1 Encounters:  ?06/30/21 (!) 150/70  ?   ?  ?  Passed - Cr in normal range and within 360 days  ?  Creatinine, Ser  ?Date Value Ref Range Status  ?05/08/2021 0.84 0.57 - 1.00 mg/dL Final  ?   ?  ?  Passed - AST in normal range and within 360 days  ?  AST  ?Date Value Ref Range Status  ?05/08/2021 18 0 - 40 IU/L Final  ?   ?  ?  Passed - ALT in normal range and within 360 days  ?  ALT  ?Date Value Ref Range Status  ?05/08/2021 13 0 - 32 IU/L Final  ?   ?  ?  Passed - Last Heart Rate in normal range  ?  Pulse Readings from Last 1 Encounters:  ?06/30/21 67  ?   ?  ?  Passed - Valid encounter within last 6 months  ?  Recent Outpatient Visits   ?      ? 8 months ago History of MI (myocardial infarction)  ? [facility] Larae Grooms, NP  ? 11 months ago Myocardial infarction, unspecified MI type, unspecified artery (HCC)  ? [facility] Larae Grooms, NP  ?  ?  ? ?  ?  ?  ? ? ?

## 2022-05-01 ENCOUNTER — Ambulatory Visit (INDEPENDENT_AMBULATORY_CARE_PROVIDER_SITE_OTHER): Payer: Medicare Other | Admitting: Nurse Practitioner

## 2022-05-01 ENCOUNTER — Encounter: Payer: Self-pay | Admitting: Nurse Practitioner

## 2022-05-01 VITALS — BP 138/78 | HR 63 | Temp 98.4°F | Wt 161.9 lb

## 2022-05-01 DIAGNOSIS — E782 Mixed hyperlipidemia: Secondary | ICD-10-CM

## 2022-05-01 DIAGNOSIS — I10 Essential (primary) hypertension: Secondary | ICD-10-CM | POA: Diagnosis not present

## 2022-05-01 DIAGNOSIS — Z1231 Encounter for screening mammogram for malignant neoplasm of breast: Secondary | ICD-10-CM | POA: Diagnosis not present

## 2022-05-01 DIAGNOSIS — E785 Hyperlipidemia, unspecified: Secondary | ICD-10-CM | POA: Insufficient documentation

## 2022-05-01 DIAGNOSIS — Z136 Encounter for screening for cardiovascular disorders: Secondary | ICD-10-CM | POA: Diagnosis not present

## 2022-05-01 DIAGNOSIS — I252 Old myocardial infarction: Secondary | ICD-10-CM

## 2022-05-01 MED ORDER — SIMVASTATIN 40 MG PO TABS: ORAL_TABLET | ORAL | 1 refills | Status: DC

## 2022-05-01 MED ORDER — CARVEDILOL 12.5 MG PO TABS: ORAL_TABLET | ORAL | 1 refills | Status: DC

## 2022-05-01 NOTE — Assessment & Plan Note (Signed)
Chronic.  Controlled.  Continue with current medication regimen of Carvedilol 12.5mg  BID. Refill sent today.   Labs ordered today.  Return to clinic in 6 months for reevaluation.  Call sooner if concerns arise.

## 2022-05-01 NOTE — Assessment & Plan Note (Signed)
Chronic.  Controlled.  Continue with current medication regimen of Simvastatin and Zetia.  Goal is to keep LDL below 60.  Labs ordered today.  Return to clinic in 6 months for reevaluation.  Call sooner if concerns arise.   

## 2022-05-01 NOTE — Progress Notes (Signed)
BP 138/78   Pulse 63   Temp 98.4 F (36.9 C) (Oral)   Wt 161 lb 14.4 oz (73.4 kg)   SpO2 98%   BMI 26.13 kg/m    Subjective:    Patient ID: Debbie Fleming, female    DOB: 01/22/1946, 76 y.o.   MRN: [id number]  HPI: Debbie Fleming is a 76 y.o. female  Chief Complaint  Patient presents with   Medication Refill    Patient states she would like refill on simvastatin. Patient also states she would like a referral for her mammogram    Patient's blood pressure is 115/80.  Patient is here today for a routine follow up. She denies concerns at visit today.  She recently saw the eye doctor for double vision who didn't find anything wrong.  She saw an Ortho about her knee and is doing physical therapy.   HYPERLIPIDEMIA Hyperlipidemia status: excellent compliance Satisfied with current treatment?  no Side effects:  no Medication compliance: excellent compliance Past cholesterol meds: simvastatin (zocor) Supplements: none Aspirin:  no The ASCVD Risk score (Arnett DK, et al., 2019) failed to calculate for the following reasons:   The patient has a prior MI or stroke diagnosis Chest pain:  no Coronary artery disease:  no Family history CAD:  no Family history early CAD:  no   Denies HA, CP, SOB, dizziness, palpitations, visual changes, and lower extremity swelling.   Relevant past medical, surgical, family and social history reviewed and updated as indicated. Interim medical history since our last visit reviewed. Allergies and medications reviewed and updated.  Review of Systems  Eyes:  Negative for visual disturbance.  Respiratory:  Negative for cough, chest tightness and shortness of breath.   Cardiovascular:  Negative for chest pain, palpitations and leg swelling.  Neurological:  Negative for dizziness and headaches.    Per HPI unless specifically indicated above     Objective:    BP 138/78   Pulse 63   Temp 98.4 F (36.9 C) (Oral)   Wt 161 lb 14.4 oz (73.4 kg)   SpO2 98%    BMI 26.13 kg/m   Wt Readings from Last 3 Encounters:  05/01/22 161 lb 14.4 oz (73.4 kg)  06/30/21 157 lb 6 oz (71.4 kg)  05/08/21 157 lb 8 oz (71.4 kg)    Physical Exam Vitals and nursing note reviewed.  Constitutional:      General: She is not in acute distress.    Appearance: Normal appearance. She is normal weight. She is not ill-appearing, toxic-appearing or diaphoretic.  HENT:     Head: Normocephalic.     Right Ear: External ear normal.     Left Ear: External ear normal.     Nose: Nose normal.     Mouth/Throat:     Mouth: Mucous membranes are moist.     Pharynx: Oropharynx is clear.  Eyes:     General:        Right eye: No discharge.        Left eye: No discharge.     Extraocular Movements: Extraocular movements intact.     Conjunctiva/sclera: Conjunctivae normal.     Pupils: Pupils are equal, round, and reactive to light.  Cardiovascular:     Rate and Rhythm: Normal rate and regular rhythm.     Heart sounds: No murmur heard. Pulmonary:     Effort: Pulmonary effort is normal. No respiratory distress.     Breath sounds: Normal breath sounds. No wheezing or rales.  Musculoskeletal:     Cervical back: Normal range of motion and neck supple.  Skin:    General: Skin is warm and dry.     Capillary Refill: Capillary refill takes less than 2 seconds.  Neurological:     General: No focal deficit present.     Mental Status: She is alert and oriented to person, place, and time. Mental status is at baseline.  Psychiatric:        Mood and Affect: Mood normal.        Behavior: Behavior normal.        Thought Content: Thought content normal.        Judgment: Judgment normal.     Results for orders placed or performed in visit on 05/08/21  Comp Met (CMET)  Result Value Ref Range   Glucose 96 65 - 99 mg/dL   BUN 23 8 - 27 mg/dL   Creatinine, Ser 0.84 0.57 - 1.00 mg/dL   eGFR 73 >59 mL/min/1.73   BUN/Creatinine Ratio 27 12 - 28   Sodium 142 134 - 144 mmol/L   Potassium  4.6 3.5 - 5.2 mmol/L   Chloride 104 96 - 106 mmol/L   CO2 24 20 - 29 mmol/L   Calcium 9.6 8.7 - 10.3 mg/dL   Total Protein 6.7 6.0 - 8.5 g/dL   Albumin 4.7 3.7 - 4.7 g/dL   Globulin, Total 2.0 1.5 - 4.5 g/dL   Albumin/Globulin Ratio 2.4 (H) 1.2 - 2.2   Bilirubin Total 0.3 0.0 - 1.2 mg/dL   Alkaline Phosphatase 71 44 - 121 IU/L   AST 18 0 - 40 IU/L   ALT 13 0 - 32 IU/L  Lipid Profile  Result Value Ref Range   Cholesterol, Total 150 100 - 199 mg/dL   Triglycerides 104 0 - 149 mg/dL   HDL 48 >39 mg/dL   VLDL Cholesterol Cal 19 5 - 40 mg/dL   LDL Chol Calc (NIH) 83 0 - 99 mg/dL   Chol/HDL Ratio 3.1 0.0 - 4.4 ratio  Hepatitis C antibody  Result Value Ref Range   Hep C Virus Ab <0.1 0.0 - 0.9 s/co ratio      Assessment & Plan:   Problem List Items Addressed This Visit       Cardiovascular and Mediastinum   Hypertension    Chronic.  Controlled.  Continue with current medication regimen of Carvedilol 12.26m BID. Refill sent today.   Labs ordered today.  Return to clinic in 6 months for reevaluation.  Call sooner if concerns arise.        Relevant Medications   simvastatin (ZOCOR) 40 MG tablet   carvedilol (COREG) 12.5 MG tablet     Other   History of MI (myocardial infarction)   Relevant Orders   Comp Met (CMET)   Hyperlipidemia - Primary    Chronic.  Controlled.  Continue with current medication regimen of Simvastatin and Zetia.  Goal is to keep LDL below 60.  Labs ordered today.  Return to clinic in 6 months for reevaluation.  Call sooner if concerns arise.        Relevant Medications   simvastatin (ZOCOR) 40 MG tablet   carvedilol (COREG) 12.5 MG tablet   Other Visit Diagnoses     Encounter for screening mammogram for malignant neoplasm of breast       Relevant Orders   MM 3D SCREEN BREAST BILATERAL   Screening for ischemic heart disease       Relevant Orders  Comp Met (CMET)   Lipid Profile        Follow up plan: Return in about 6 months (around  11/01/2022) for HTN, HLD, DM2 FU.

## 2022-05-02 LAB — COMPREHENSIVE METABOLIC PANEL
ALT: 16 IU/L (ref 0–32)
AST: 19 IU/L (ref 0–40)
Albumin/Globulin Ratio: 2.3 — ABNORMAL HIGH (ref 1.2–2.2)
Albumin: 4.5 g/dL (ref 3.8–4.8)
Alkaline Phosphatase: 66 IU/L (ref 44–121)
BUN/Creatinine Ratio: 22 (ref 12–28)
BUN: 17 mg/dL (ref 8–27)
Bilirubin Total: 0.3 mg/dL (ref 0.0–1.2)
CO2: 24 mmol/L (ref 20–29)
Calcium: 9.5 mg/dL (ref 8.7–10.3)
Chloride: 106 mmol/L (ref 96–106)
Creatinine, Ser: 0.76 mg/dL (ref 0.57–1.00)
Globulin, Total: 2 g/dL (ref 1.5–4.5)
Glucose: 90 mg/dL (ref 70–99)
Potassium: 4.8 mmol/L (ref 3.5–5.2)
Sodium: 142 mmol/L (ref 134–144)
Total Protein: 6.5 g/dL (ref 6.0–8.5)
eGFR: 82 mL/min/{1.73_m2} (ref >59)

## 2022-05-02 LAB — LIPID PANEL
Chol/HDL Ratio: 3 ratio (ref 0.0–4.4)
Cholesterol, Total: 155 mg/dL (ref 100–199)
HDL: 51 mg/dL (ref >39)
LDL Chol Calc (NIH): 77 mg/dL (ref 0–99)
Triglycerides: 158 mg/dL — ABNORMAL HIGH (ref 0–149)
VLDL Cholesterol Cal: 27 mg/dL (ref 5–40)

## 2022-05-04 NOTE — Progress Notes (Signed)
Please let patient know that her lab work looks good.  No concerns at this time. Continue with current medication regimen.  Follow up as discussed.

## 2022-05-05 ENCOUNTER — Other Ambulatory Visit: Payer: Self-pay | Admitting: Nurse Practitioner

## 2022-05-05 ENCOUNTER — Other Ambulatory Visit: Payer: Self-pay | Admitting: Family Medicine

## 2022-05-05 DIAGNOSIS — Z1231 Encounter for screening mammogram for malignant neoplasm of breast: Secondary | ICD-10-CM

## 2022-05-05 DIAGNOSIS — N63 Unspecified lump in unspecified breast: Secondary | ICD-10-CM

## 2022-06-08 ENCOUNTER — Ambulatory Visit
Admission: RE | Admit: 2022-06-08 | Discharge: 2022-06-08 | Disposition: A | Discharge status: Home / Self Care | Payer: Medicare Other | Source: Ambulatory Visit | Attending: Nurse Practitioner | Admitting: Nurse Practitioner

## 2022-06-08 DIAGNOSIS — R922 Inconclusive mammogram: Secondary | ICD-10-CM | POA: Diagnosis not present

## 2022-06-08 DIAGNOSIS — Z1231 Encounter for screening mammogram for malignant neoplasm of breast: Secondary | ICD-10-CM | POA: Diagnosis present

## 2022-06-08 DIAGNOSIS — N63 Unspecified lump in unspecified breast: Secondary | ICD-10-CM

## 2022-06-08 NOTE — Progress Notes (Signed)
Please let patient know her Mammogram did not show any evidence of a malignancy.  The recommendation is to repeat the Mammogram in 1 year.  

## 2022-06-22 ENCOUNTER — Other Ambulatory Visit: Payer: Self-pay | Admitting: Cardiovascular Disease

## 2022-07-03 ENCOUNTER — Encounter: Payer: Self-pay | Admitting: Medical

## 2022-07-03 ENCOUNTER — Ambulatory Visit: Payer: Medicare Other | Attending: Medical | Admitting: Medical

## 2022-07-03 VITALS — BP 142/78 | HR 83 | Ht 65.5 in | Wt 164.4 lb

## 2022-07-03 DIAGNOSIS — I10 Essential (primary) hypertension: Secondary | ICD-10-CM

## 2022-07-03 DIAGNOSIS — I25118 Atherosclerotic heart disease of native coronary artery with other forms of angina pectoris: Secondary | ICD-10-CM | POA: Diagnosis not present

## 2022-07-03 DIAGNOSIS — Z951 Presence of aortocoronary bypass graft: Secondary | ICD-10-CM | POA: Diagnosis not present

## 2022-07-03 DIAGNOSIS — E782 Mixed hyperlipidemia: Secondary | ICD-10-CM | POA: Diagnosis not present

## 2022-07-03 NOTE — Patient Instructions (Signed)
Medication Instructions:   Your physician recommends that you continue on your current medications as directed. Please refer to the Current Medication list given to you today.  *If you need a refill on your cardiac medications before your next appointment, please call your pharmacy*  Follow-Up: At [facility], you and your health needs are our priority.  As part of our continuing mission to provide you with exceptional heart care, we have created designated Provider Care Teams.  These Care Teams include your primary Cardiologist (physician) and Advanced Practice Providers (APPs -  Physician Assistants and Nurse Practitioners) who all work together to provide you with the care you need, when you need it.  We recommend signing up for the patient portal called "MyChart".  Sign up information is provided on this After Visit Summary.  MyChart is used to connect with patients for Virtual Visits (Telemedicine).  Patients are able to view lab/test results, encounter notes, upcoming appointments, etc.  Non-urgent messages can be sent to your provider as well.   To learn more about what you can do with MyChart, go to [email or web address].    Your next appointment:   1 year(s)  The format for your next appointment:   In Person  Provider:   You may see Ida Rogue, MD or one of the following Advanced Practice Providers on your designated Care Team:   Murray Hodgkins, NP Christell Faith, PA-C Cadence Kathlen Mody, PA-C Gerrie Nordmann, NP    Other Instructions   Important Information About Sugar

## 2022-07-03 NOTE — Progress Notes (Signed)
Cardiology Office Note:    Date:  07/03/2022   ID:  Debbie Fleming, DOB 1945-12-12, MRN [id number]  PCP:  Larae Grooms, NP  [facility] Cardiologist:  None  [facility] Electrophysiologist:  None   Referring MD: Larae Grooms, NP   Chief Complaint: 12 month follow-up  History of Present Illness:    Debbie Fleming is a 76 y.o. female with a hx of CAD s/p CABG 2009, HLD, HTN who presents for 12 month follow-up.  Patient had NSTEMI in 2009  Catheterization November 2009, had NSTEMI 20 to 30% left main stenosis, 50% near ostial stenosis after takeoff of high diagonal followed by 80% stenosis proximal LAD, 70 to 99% tandem lesions in the mid LAD, 60% apical LAD, no circumflex disease, 80% mid RCA. ON August 16, 2008 the patient underwent LIMA to mLAD, Vein graft to the RCA. Echoin 2011 showed LVEF 50-55%, mild MR/TR.   Last seen 06/2021 as a new patient. She had moved from [place]. No changes were made.   Today, the patient reports she is overall doing well. She is going to orhto for arthritis in her knees. She is wanting to walk more, so she is doing PT. She also does a stationary bike at times. She denies chest pain or SOB. No lower leg edema, orthopnea, pnd, lightheadedness, dizziness, palpitations. BP is a little high, however at home it's 115/74. Diet is pretty good.   Past Medical History:  Diagnosis Date   Coronary artery disease    Hyperlipidemia    Hypertension    Myocardial infarction [facility]) 2009   Myocardial infarction (HCC) 02/06/2021    Past Surgical History:  Procedure Laterality Date   ABDOMINAL HYSTERECTOMY     Uterus removed    CARDIAC CATHETERIZATION     CARDIAC SURGERY     Bypass   TOE SURGERY Right    Arthritis in big  toe    Current Medications: Current Meds  Medication Sig   aspirin EC 81 MG tablet Take 81 mg by mouth daily. Swallow whole.   Calcium Carbonate-Vitamin D 600-200 MG-UNIT TABS Take by mouth 2 (two) times daily.   carvedilol  (COREG) 12.5 MG tablet TAKE 1 TABLET(12.5 MG) BY MOUTH TWICE DAILY   ezetimibe (ZETIA) 10 MG tablet TAKE 1 TABLET(10 MG) BY MOUTH DAILY   magnesium oxide (MAG-OX) 400 MG tablet Take 400 mg by mouth daily.   Multiple Vitamin (MULTIVITAMIN) tablet Take 1 tablet by mouth daily.   simvastatin (ZOCOR) 40 MG tablet SMARTSIG:1 Tablet(s) By Mouth Every Evening     Allergies:   Patient has no known allergies.   Social History   Socioeconomic History   Marital status: Married    Spouse name: Not on file   Number of children: Not on file   Years of education: Not on file   Highest education level: Not on file  Occupational History   Not on file  Tobacco Use   Smoking status: Never   Smokeless tobacco: Never  Vaping Use   Vaping Use: Never used  Substance and Sexual Activity   Alcohol use: Never   Drug use: Never   Sexual activity: Yes    Birth control/protection: Post-menopausal  Other Topics Concern   Not on file  Social History Narrative   Not on file   Social Determinants of Health   Financial Resource Strain: Low Risk  (12/16/2021)   Overall Financial Resource Strain (CARDIA)    Difficulty of Paying Living Expenses: Not hard at all  Food Insecurity: No Food Insecurity (12/16/2021)   Hunger Vital Sign    Worried About Running Out of Food in the Last Year: Never true    Ran Out of Food in the Last Year: Never true  Transportation Needs: No Transportation Needs (12/16/2021)   PRAPARE - Administrator, Civil Service (Medical): No    Lack of Transportation (Non-Medical): No  Physical Activity: Sufficiently Active (12/16/2021)   Exercise Vital Sign    Days of Exercise per Week: 4 days    Minutes of Exercise per Session: 40 min  Stress: No Stress Concern Present (12/16/2021)   [employer] of Occupational Health - Occupational Stress Questionnaire    Feeling of Stress : Not at all  Social Connections: Moderately Integrated (12/16/2021)   Social Connection and Isolation  Panel [NHANES]    Frequency of Communication with Friends and Family: More than three times a week    Frequency of Social Gatherings with Friends and Family: Twice a week    Attends Religious Services: More than 4 times per year    Active Member of [employer] or Organizations: No    Attends Engineer, structural: Never    Marital Status: Married     Family History: The patient's family history includes Breast cancer in her maternal grandmother; Cancer in her maternal grandfather, mother, and son; Heart disease in her father, paternal grandfather, and paternal grandmother.  ROS:   Please see the history of present illness.     All other systems reviewed and are negative.  EKGs/Labs/Other Studies Reviewed:    The following studies were reviewed today:   N/A  EKG:  EKG is ordered today.  The ekg ordered today demonstrates NSR 83bpm, LAD, low voltage, nonspecific ST/T wave changes  Recent Labs: 05/01/2022: ALT 16; BUN 17; Creatinine, Ser 0.76; Potassium 4.8; Sodium 142  Recent Lipid Panel    Component Value Date/Time   CHOL 155 05/01/2022 1318   TRIG 158 (H) 05/01/2022 1318   HDL 51 05/01/2022 1318   CHOLHDL 3.0 05/01/2022 1318   LDLCALC 77 05/01/2022 1318     Physical Exam:    VS:  BP (!) 142/78 (BP Location: Left Arm, Patient Position: Sitting, Cuff Size: Normal)   Pulse 83   Ht 5' 5.5" (1.664 m)   Wt 164 lb 6.4 oz (74.6 kg)   SpO2 97%   BMI 26.94 kg/m     Wt Readings from Last 3 Encounters:  07/03/22 164 lb 6.4 oz (74.6 kg)  05/01/22 161 lb 14.4 oz (73.4 kg)  06/30/21 157 lb 6 oz (71.4 kg)     GEN:  Well nourished, well developed in no acute distress HEENT: Normal NECK: No JVD; No carotid bruits LYMPHATICS: No lymphadenopathy CARDIAC: RRR, no murmurs, rubs, gallops RESPIRATORY:  Clear to auscultation without rales, wheezing or rhonchi  ABDOMEN: Soft, non-tender, non-distended MUSCULOSKELETAL:  No edema; No deformity  SKIN: Warm and dry NEUROLOGIC:  Alert  and oriented x 3 PSYCHIATRIC:  Normal affect   ASSESSMENT:    1. Coronary artery disease of native artery of native heart with stable angina pectoris (HCC)   2. Hx of CABG   3. Essential hypertension   4. Hyperlipidemia, mixed    PLAN:    In order of problems listed above:  CAD s/p CABG in 2009 Patient denies anginal symptoms. She is seeing orthopaedic surgeon regarding b/l knee arthritis because she is wanting to walk more. She has done PT, which helped. Continue Aspirin, statin,  Zetia and Coreg. No further ischemic work-up indicated at this time.   HTN BP is mildly elevated today, however at home is generally 115/70s. No changes for now, she will continue to monitor at home. Continue Coreg 12.5mg BID.   HLD LDL 77. I will refill Zetia today and continue Simvastatin 40mg  daily.    Disposition: Follow up in 1 year(s) with MD/APP   Signed, Kriss Ishler Ninfa Meeker, PA-C  07/03/2022 10:58 AM    [facility]

## 2022-07-27 ENCOUNTER — Telehealth: Payer: Self-pay | Admitting: Nurse Practitioner

## 2022-07-27 NOTE — Telephone Encounter (Signed)
[place] called and spoke to [place], [facility] about the refill(s) carvedilol requested. Advised it was sent on 05/01/22 #180/1 refill(s). She says it's ready for pickup.

## 2022-07-27 NOTE — Telephone Encounter (Signed)
Medication Refill - Medication: 90 day supply carvedilol (COREG) 12.5 MG tablet  Has the patient contacted their pharmacy? Yes.    (Agent: If yes, when and what did the pharmacy advise?) Contact PCP office. Pharmacy stated they was unable to connect to PCP office   Preferred Pharmacy (with phone number or street name):  [facility], [place] - [address] Phone:  [phone]  Fax:  [phone]      Has the patient been seen for an appointment in the last year OR does the patient have an upcoming appointment? Yes.    Agent: Please be advised that RX refills may take up to 3 business days. We ask that you follow-up with your pharmacy.

## 2022-07-28 ENCOUNTER — Telehealth: Payer: Self-pay | Admitting: Cardiovascular Disease

## 2022-07-28 MED ORDER — EZETIMIBE 10 MG PO TABS: ORAL_TABLET | ORAL | 5 refills | Status: AC

## 2022-07-28 NOTE — Telephone Encounter (Signed)
*  STAT* If patient is at the pharmacy, call can be transferred to refill team.   1. Which medications need to be refilled? (please list name of each medication and dose if known) ezetimibe (ZETIA) 10 MG tablet  2. Which pharmacy/location (including street and city if local pharmacy) is medication to be sent to? [id number] - [place], [place] [address] [place]  3. Do they need a 30 day or 90 day supply? [place]

## 2022-07-28 NOTE — Telephone Encounter (Signed)
Requested Prescriptions   Signed Prescriptions Disp Refills   ezetimibe (ZETIA) 10 MG tablet 30 tablet 5    Sig: TAKE 1 TABLET(10 MG) BY MOUTH DAILY    Authorizing Provider: Minna Merritts    Ordering User: Britt Bottom

## 2022-08-17 ENCOUNTER — Other Ambulatory Visit
Admission: RE | Admit: 2022-08-17 | Discharge: 2022-08-17 | Disposition: A | Discharge status: Home / Self Care | Payer: Medicare Other | Source: Ambulatory Visit | Attending: Ophthalmology | Admitting: Ophthalmology

## 2022-08-17 DIAGNOSIS — R519 Headache, unspecified: Secondary | ICD-10-CM | POA: Diagnosis present

## 2022-08-17 DIAGNOSIS — H47012 Ischemic optic neuropathy, left eye: Secondary | ICD-10-CM | POA: Insufficient documentation

## 2022-08-17 DIAGNOSIS — H547 Unspecified visual loss: Secondary | ICD-10-CM | POA: Insufficient documentation

## 2022-08-17 LAB — CBC
HCT: 40.6 % (ref 36.0–46.0)
Hemoglobin: 13.7 g/dL (ref 12.0–15.0)
MCH: 31.7 pg (ref 26.0–34.0)
MCHC: 33.7 g/dL (ref 30.0–36.0)
MCV: 94 fL (ref 80.0–100.0)
Platelets: 334 10*3/uL (ref 150–400)
RBC: 4.32 MIL/uL (ref 3.87–5.11)
RDW: 12.3 % (ref 11.5–15.5)
WBC: 7.4 10*3/uL (ref 4.0–10.5)
nRBC: 0 % (ref 0.0–0.2)

## 2022-08-17 LAB — C-REACTIVE PROTEIN: CRP: <0.5 mg/dL (ref <1.0)

## 2022-08-17 LAB — SEDIMENTATION RATE: Sed Rate: 8 mm/hr (ref 0–30)

## 2022-10-21 ENCOUNTER — Telehealth: Payer: Self-pay | Admitting: Cardiovascular Disease

## 2022-10-21 ENCOUNTER — Other Ambulatory Visit: Payer: Self-pay | Admitting: Nurse Practitioner

## 2022-10-21 MED ORDER — SIMVASTATIN 40 MG PO TABS: ORAL_TABLET | ORAL | 0 refills | Status: DC

## 2022-10-21 MED ORDER — CARVEDILOL 12.5 MG PO TABS: ORAL_TABLET | ORAL | 0 refills | Status: DC

## 2022-10-21 NOTE — Telephone Encounter (Signed)
Pt requesting refills, Please review for refills.

## 2022-10-21 NOTE — Telephone Encounter (Signed)
Please advise if ok to refill medications. Both medications last filled by pcp.

## 2022-10-21 NOTE — Telephone Encounter (Signed)
Refilled today by K. Holdsworth NP  Requested Prescriptions  Refused Prescriptions Disp Refills   carvedilol (COREG) 12.5 MG tablet [Pharmacy Med Name: CARVEDILOL 12.5MG  TABLETS] 180 tablet     Sig: TAKE 1 TABLET(12.5 MG) BY MOUTH TWICE DAILY     There is no refill protocol information for this order     simvastatin (ZOCOR) 40 MG tablet [Pharmacy Med Name: SIMVASTATIN 40MG  TABLETS] 90 tablet     Sig: TAKE 1 TABLET BY MOUTH EVERY EVENING     There is no refill protocol information for this order

## 2022-10-21 NOTE — Telephone Encounter (Signed)
Patient due for an appt at the end of the month.  Courtesy supply sent.

## 2022-10-21 NOTE — Telephone Encounter (Signed)
*  STAT* If patient is at the pharmacy, call can be transferred to refill team.   1. Which medications need to be refilled? (please list name of each medication and dose if known) carvedilol (COREG) 12.5 MG tablet  simvastatin (ZOCOR) 40 MG tablet   2. Which pharmacy/location (including street and city if local pharmacy) is medication to be sent to? [id number] - [place], [place] [address]   3. Do they need a 30 day or 90 day supply? 90 day

## 2022-10-21 NOTE — Telephone Encounter (Signed)
Pt notified medication has been sent in. She states she will call back to schedule her appointment

## 2022-11-09 NOTE — Progress Notes (Unsigned)
There were no vitals taken for this visit.   Subjective:    Patient ID: Debbie Fleming, female    DOB: 1946/08/24, 77 y.o.   MRN: [id number]  HPI: Debbie Fleming is a 77 y.o. female  No chief complaint on file.  Patient's blood pressure is 115/80.  Patient is here today for a routine follow up. She denies concerns at visit today.  She recently saw the eye doctor for double vision who didn't find anything wrong.  She saw an Ortho about her knee and is doing physical therapy.   HYPERLIPIDEMIA Hyperlipidemia status: excellent compliance Satisfied with current treatment?  no Side effects:  no Medication compliance: excellent compliance Past cholesterol meds: simvastatin (zocor) Supplements: none Aspirin:  no The ASCVD Risk score (Arnett DK, et al., 2019) failed to calculate for the following reasons:   The patient has a prior MI or stroke diagnosis Chest pain:  no Coronary artery disease:  no Family history CAD:  no Family history early CAD:  no   Denies HA, CP, SOB, dizziness, palpitations, visual changes, and lower extremity swelling.   Relevant past medical, surgical, family and social history reviewed and updated as indicated. Interim medical history since our last visit reviewed. Allergies and medications reviewed and updated.  Review of Systems  Eyes:  Negative for visual disturbance.  Respiratory:  Negative for cough, chest tightness and shortness of breath.   Cardiovascular:  Negative for chest pain, palpitations and leg swelling.  Neurological:  Negative for dizziness and headaches.    Per HPI unless specifically indicated above     Objective:    There were no vitals taken for this visit.  Wt Readings from Last 3 Encounters:  07/03/22 164 lb 6.4 oz (74.6 kg)  05/01/22 161 lb 14.4 oz (73.4 kg)  06/30/21 157 lb 6 oz (71.4 kg)    Physical Exam Vitals and nursing note reviewed.  Constitutional:      General: She is not in acute distress.    Appearance: Normal  appearance. She is normal weight. She is not ill-appearing, toxic-appearing or diaphoretic.  HENT:     Head: Normocephalic.     Right Ear: External ear normal.     Left Ear: External ear normal.     Nose: Nose normal.     Mouth/Throat:     Mouth: Mucous membranes are moist.     Pharynx: Oropharynx is clear.  Eyes:     General:        Right eye: No discharge.        Left eye: No discharge.     Extraocular Movements: Extraocular movements intact.     Conjunctiva/sclera: Conjunctivae normal.     Pupils: Pupils are equal, round, and reactive to light.  Cardiovascular:     Rate and Rhythm: Normal rate and regular rhythm.     Heart sounds: No murmur heard. Pulmonary:     Effort: Pulmonary effort is normal. No respiratory distress.     Breath sounds: Normal breath sounds. No wheezing or rales.  Musculoskeletal:     Cervical back: Normal range of motion and neck supple.  Skin:    General: Skin is warm and dry.     Capillary Refill: Capillary refill takes less than 2 seconds.  Neurological:     General: No focal deficit present.     Mental Status: She is alert and oriented to person, place, and time. Mental status is at baseline.  Psychiatric:  Mood and Affect: Mood normal.        Behavior: Behavior normal.        Thought Content: Thought content normal.        Judgment: Judgment normal.     Results for orders placed or performed during the hospital encounter of 08/17/22  CBC  Result Value Ref Range   WBC 7.4 4.0 - 10.5 K/uL   RBC 4.32 3.87 - 5.11 MIL/uL   Hemoglobin 13.7 12.0 - 15.0 g/dL   HCT 40.6 36.0 - 46.0 %   MCV 94.0 80.0 - 100.0 fL   MCH 31.7 26.0 - 34.0 pg   MCHC 33.7 30.0 - 36.0 g/dL   RDW 12.3 11.5 - 15.5 %   Platelets 334 150 - 400 K/uL   nRBC 0.0 0.0 - 0.2 %  C-reactive protein  Result Value Ref Range   CRP <0.5 <1.0 mg/dL  Sedimentation rate  Result Value Ref Range   Sed Rate 8 0 - 30 mm/hr      Assessment & Plan:   Problem List Items Addressed  This Visit       Cardiovascular and Mediastinum   Hypertension - Primary     Other   Hyperlipidemia     Follow up plan: No follow-ups on file.

## 2022-11-10 ENCOUNTER — Ambulatory Visit (INDEPENDENT_AMBULATORY_CARE_PROVIDER_SITE_OTHER): Payer: Medicare Other | Admitting: Nurse Practitioner

## 2022-11-10 ENCOUNTER — Encounter: Payer: Self-pay | Admitting: Nurse Practitioner

## 2022-11-10 VITALS — BP 134/83 | HR 80 | Temp 98.1°F | Wt 167.5 lb

## 2022-11-10 DIAGNOSIS — I10 Essential (primary) hypertension: Secondary | ICD-10-CM | POA: Diagnosis not present

## 2022-11-10 DIAGNOSIS — E782 Mixed hyperlipidemia: Secondary | ICD-10-CM | POA: Diagnosis not present

## 2022-11-10 DIAGNOSIS — Z23 Encounter for immunization: Secondary | ICD-10-CM

## 2022-11-10 MED ORDER — CARVEDILOL 12.5 MG PO TABS: ORAL_TABLET | ORAL | 1 refills | Status: DC

## 2022-11-10 MED ORDER — SIMVASTATIN 40 MG PO TABS: ORAL_TABLET | ORAL | 1 refills | Status: DC

## 2022-11-10 NOTE — Assessment & Plan Note (Addendum)
Chronic.  Controlled.  Continue with current medication regimen of Carvedilol 12.5mg  BID. Refill sent today.   Labs ordered today.  Return to clinic in 6 months for reevaluation.  Call sooner if concerns arise.  Patient is moving to [place] and will be finding a new PCP.

## 2022-11-10 NOTE — Assessment & Plan Note (Signed)
Chronic.  Controlled.  Continue with current medication regimen of Simvastatin and Zetia.  Goal is to keep LDL below 60.  Labs ordered today.  Return to clinic in 6 months for reevaluation.  Call sooner if concerns arise.

## 2022-11-11 LAB — COMPREHENSIVE METABOLIC PANEL
ALT: 21 IU/L (ref 0–32)
AST: 21 IU/L (ref 0–40)
Albumin/Globulin Ratio: 2.3 — ABNORMAL HIGH (ref 1.2–2.2)
Albumin: 4.5 g/dL (ref 3.8–4.8)
Alkaline Phosphatase: 71 IU/L (ref 44–121)
BUN/Creatinine Ratio: 26 (ref 12–28)
BUN: 23 mg/dL (ref 8–27)
Bilirubin Total: 0.5 mg/dL (ref 0.0–1.2)
CO2: 23 mmol/L (ref 20–29)
Calcium: 9.8 mg/dL (ref 8.7–10.3)
Chloride: 104 mmol/L (ref 96–106)
Creatinine, Ser: 0.88 mg/dL (ref 0.57–1.00)
Globulin, Total: 2 g/dL (ref 1.5–4.5)
Glucose: 95 mg/dL (ref 70–99)
Potassium: 4.8 mmol/L (ref 3.5–5.2)
Sodium: 142 mmol/L (ref 134–144)
Total Protein: 6.5 g/dL (ref 6.0–8.5)
eGFR: 69 mL/min/{1.73_m2} (ref >59)

## 2022-11-11 LAB — LIPID PANEL
Chol/HDL Ratio: 2.8 ratio (ref 0.0–4.4)
Cholesterol, Total: 130 mg/dL (ref 100–199)
HDL: 46 mg/dL (ref >39)
LDL Chol Calc (NIH): 63 mg/dL (ref 0–99)
Triglycerides: 118 mg/dL (ref 0–149)
VLDL Cholesterol Cal: 21 mg/dL (ref 5–40)

## 2022-11-11 NOTE — Progress Notes (Signed)
Please let patient know that her lab work looks great.  No concerns at this time.  Continue with current medication regimen.

## 2023-02-15 ENCOUNTER — Telehealth: Payer: Self-pay | Admitting: Nurse Practitioner

## 2023-02-15 NOTE — Telephone Encounter (Signed)
Contacted Debbie Fleming to schedule their annual wellness visit. Patient declined to schedule AWV at this time. She stated she moved to [facility], provider Dwana Curd; Care Guide Ambulatory Clinical Support [place] [facility] Direct Dial: [phone]

## 2023-04-19 IMAGING — MG DIGITAL DIAGNOSTIC BILAT W/ TOMO W/ CAD
6 of 10 series · 6 of 30 positions shown · non-contrast
Comparison: Previous exam(s).

CLINICAL DATA: One year follow-up for probably benign mass versus
cluster of cysts in the right breast.

EXAM:
DIGITAL DIAGNOSTIC BILATERAL MAMMOGRAM WITH TOMOSYNTHESIS AND CAD;
ULTRASOUND RIGHT BREAST LIMITED
TECHNIQUE: Bilateral digital diagnostic mammography and breast tomosynthesis
was performed. The images were evaluated with computer-aided
detection.; Targeted ultrasound examination of the right breast was
performed

[R CC synth-2D]
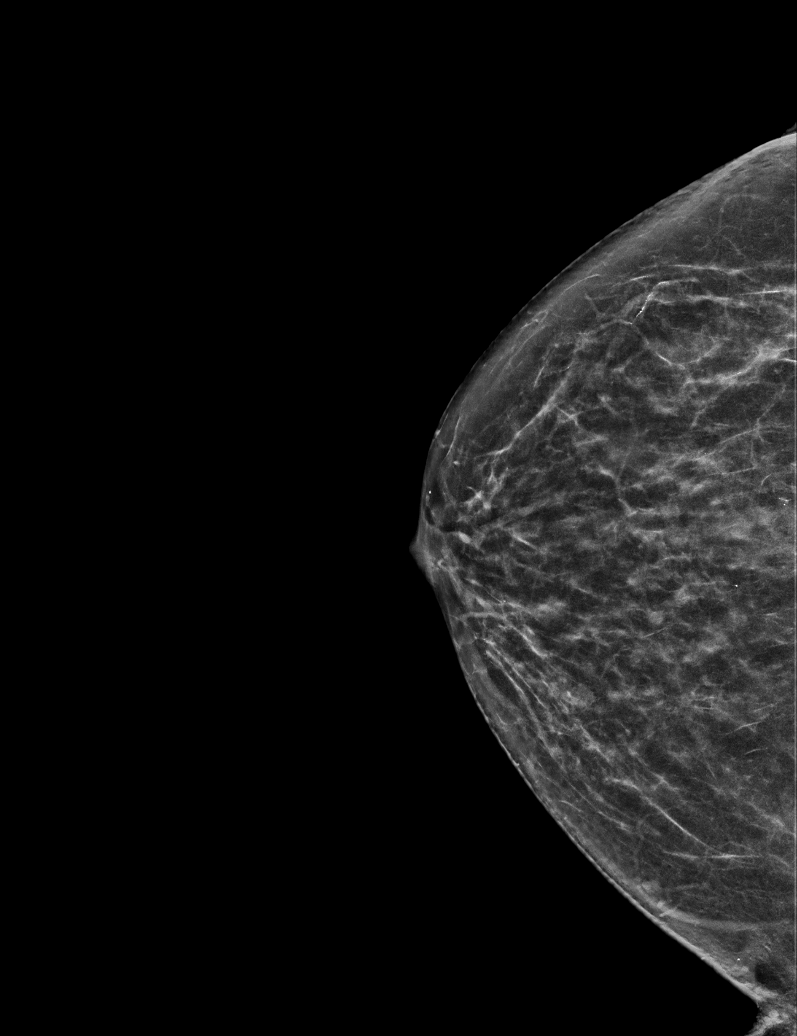

[L MLO synth-2D]
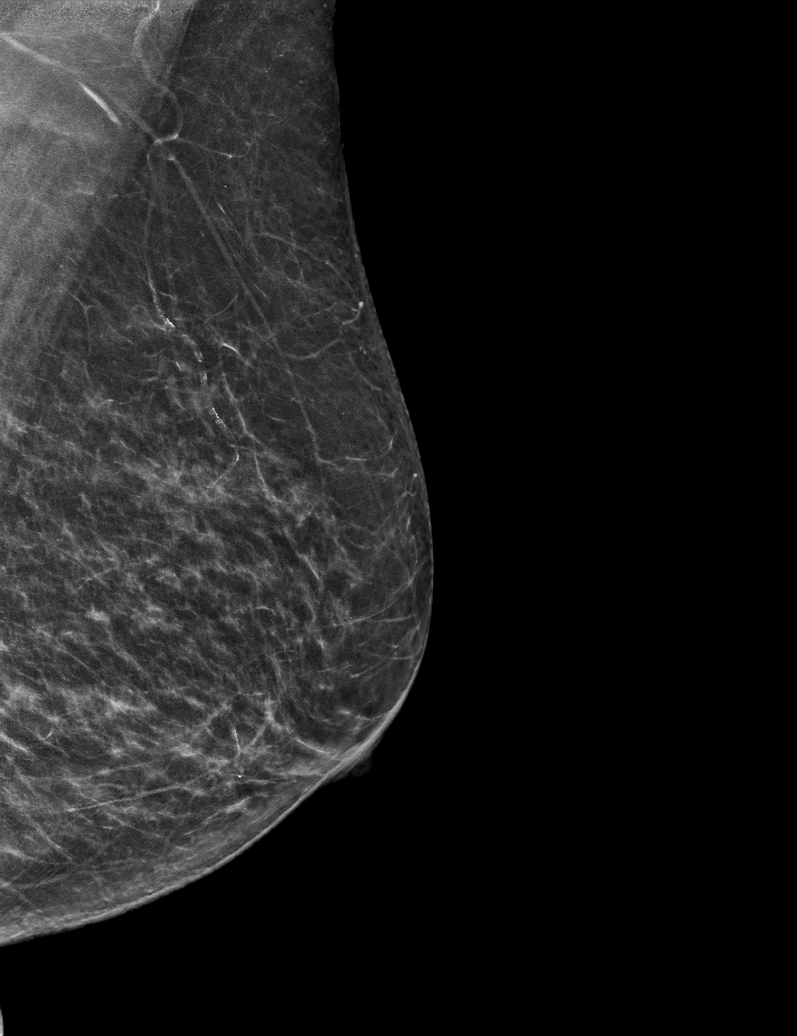

[R MLO synth-2D (1 of 2)]
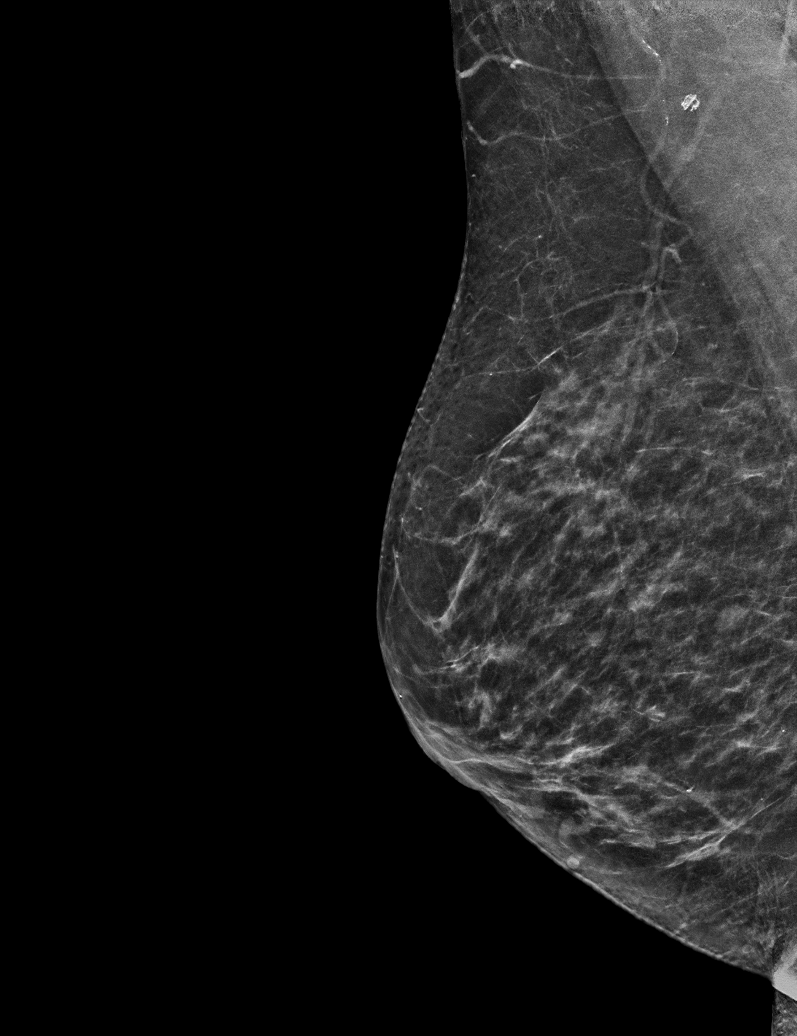

[L CC synth-2D]
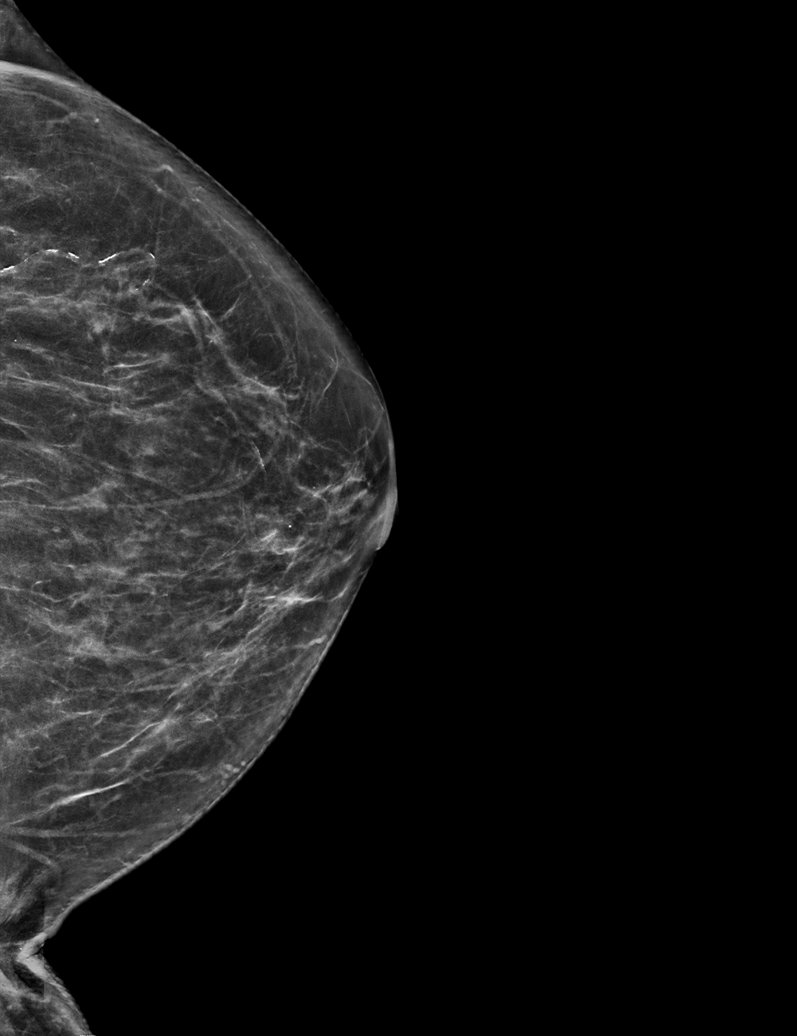

[R MLO synth-2D (2 of 2)]
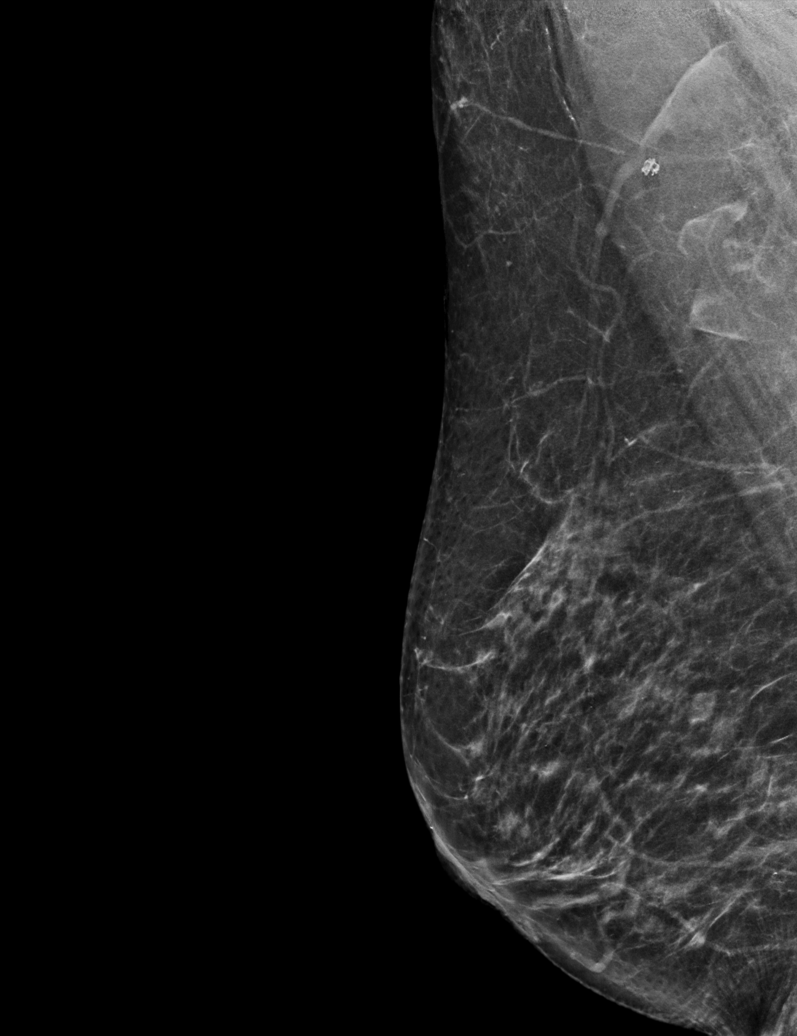

[R MLO tomo · tomo slice 33/64.0]
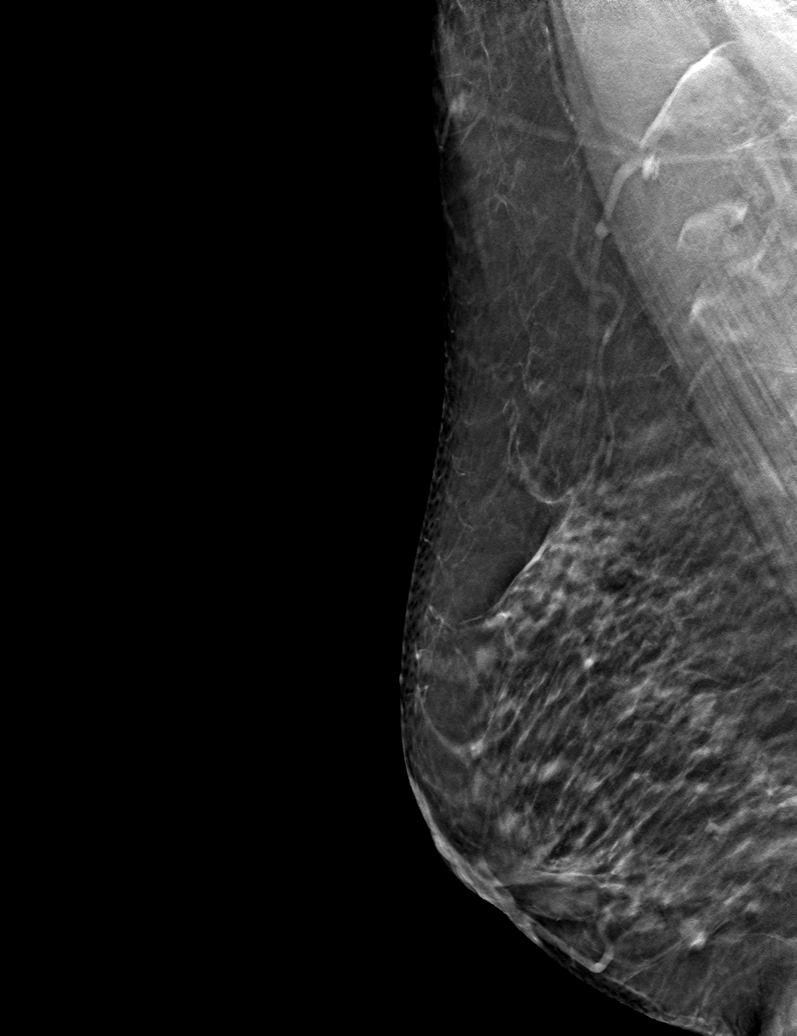

[6 of 30 positions shown; findings below may reference images not displayed]

ACR Breast Density Category b: There are scattered areas of
fibroglandular density.
FINDINGS: RIGHT breast: A 0.8 cm circumscribed oval mass in the right breast 9
o'clock position at posterior depth is mammographically stable
dating back to 04/08/2020. No new suspicious masses, calcifications,
or other findings in the right breast.

Targeted ultrasound of the right breast at the 9 o'clock position 1
cm from the nipple demonstrates a 0.8 x 0.3 x 0.5 cm oval
circumscribed hypoechoic parallel mass, previously measuring 0.9 x
0.3 x 0.5 cm on ultrasound 04/08/2020.

LEFT breast: No new suspicious masses, calcifications or other
findings in the left breast.
IMPRESSION: Documented 1 year of stability of 0.8 cm probably benign mass at the
right breast 9 o'clock position. Recommend follow-up in 12 months to
establish 2 years of stability.

RECOMMENDATION:
Bilateral diagnostic mammogram with right breast ultrasound in 1
year.

I have discussed the findings and recommendations with the patient.
If applicable, a reminder letter will be sent to the patient
regarding the next appointment.

BI-RADS CATEGORY  3: Probably benign.

## 2023-06-22 ENCOUNTER — Other Ambulatory Visit: Payer: Self-pay | Admitting: Nurse Practitioner

## 2023-06-23 NOTE — Telephone Encounter (Signed)
Requested Prescriptions  Pending Prescriptions Disp Refills   carvedilol (COREG) 12.5 MG tablet [Pharmacy Med Name: CARVEDILOL 12.5MG  TABLETS] 180 tablet 0    Sig: TAKE 1 TABLET(12.5 MG) BY MOUTH TWICE DAILY     Cardiovascular: Beta Blockers 3 Failed - 06/22/2023  9:47 AM      Failed - Valid encounter within last 6 months    Recent Outpatient Visits           7 months ago Primary hypertension   [place] [facility] Larae Grooms, NP   1 year ago Mixed hyperlipidemia   [place] [facility] Larae Grooms, NP   2 years ago History of MI (myocardial infarction)   [place] [facility] Larae Grooms, NP   2 years ago Myocardial infarction, unspecified MI type, unspecified artery (HCC)   [place] [facility] Larae Grooms, NP              Passed - Cr in normal range and within 360 days    Creatinine, Ser  Date Value Ref Range Status  11/10/2022 0.88 0.57 - 1.00 mg/dL Final         Passed - AST in normal range and within 360 days    AST  Date Value Ref Range Status  11/10/2022 21 0 - 40 IU/L Final         Passed - ALT in normal range and within 360 days    ALT  Date Value Ref Range Status  11/10/2022 21 0 - 32 IU/L Final         Passed - Last BP in normal range    BP Readings from Last 1 Encounters:  11/10/22 134/83         Passed - Last Heart Rate in normal range    Pulse Readings from Last 1 Encounters:  11/10/22 80          simvastatin (ZOCOR) 40 MG tablet [Pharmacy Med Name: SIMVASTATIN 40MG  TABLETS] 90 tablet 1    Sig: TAKE 1 TABLET BY MOUTH EVERY EVENING     Cardiovascular:  Antilipid - Statins Failed - 06/22/2023  9:47 AM      Failed - Lipid Panel in normal range within the last 12 months    Cholesterol, Total  Date Value Ref Range Status  11/10/2022 130 100 - 199 mg/dL Final   LDL Chol Calc (NIH)  Date Value Ref Range Status  11/10/2022 63 0 - 99 mg/dL Final   HDL  Date  Value Ref Range Status  11/10/2022 46 >39 mg/dL Final   Triglycerides  Date Value Ref Range Status  11/10/2022 118 0 - 149 mg/dL Final         Passed - Patient is not pregnant      Passed - Valid encounter within last 12 months    Recent Outpatient Visits           7 months ago Primary hypertension   [place] [facility] Larae Grooms, NP   1 year ago Mixed hyperlipidemia   [place] [facility] Larae Grooms, NP   2 years ago History of MI (myocardial infarction)   [place] [facility] Larae Grooms, NP   2 years ago Myocardial infarction, unspecified MI type, unspecified artery [facility])   [facility] Larae Grooms, NP

## 2023-09-27 ENCOUNTER — Telehealth: Payer: Self-pay | Admitting: Nurse Practitioner

## 2023-09-27 NOTE — Telephone Encounter (Signed)
Copied from CRM [id number]. Topic: Medicare AWV >> Sep 27, 2023  1:48 PM Payton Doughty wrote: Reason for CRM: Called LVM 09/27/2023 to schedule Annual Wellness Visit  Verlee Rossetti; Care Guide Ambulatory Clinical Support [place] [facility] Direct Dial: [phone]

## 2024-02-07 LAB — HEMOGLOBIN A1C
Estimated Avg Glucose, External: 118 mg/dL (ref 91–123)
Hemoglobin A1C, External: 5.8 % — ABNORMAL HIGH (ref 4.8–5.6)

## 2024-04-25 ENCOUNTER — Encounter: Attending: Urology | Primary: Family Medicine
